# Patient Record
Sex: Male | Born: 1975 | Race: Black or African American | Hispanic: No | Marital: Single | Smoking: Current every day smoker
Health system: Southern US, Community
[De-identification: ages and names within clinical notes are randomized; demographics above are authoritative.]

---

## 2011-02-08 ENCOUNTER — Emergency Department: Payer: Self-pay | Admitting: Emergency Medicine

## 2011-03-08 ENCOUNTER — Emergency Department: Payer: Self-pay | Admitting: Emergency Medicine

## 2011-04-15 ENCOUNTER — Inpatient Hospital Stay: Payer: Self-pay | Admitting: Unknown Physician Specialty

## 2012-02-14 ENCOUNTER — Emergency Department: Payer: Self-pay | Admitting: Emergency Medicine

## 2012-04-19 ENCOUNTER — Emergency Department: Payer: Self-pay | Admitting: *Deleted

## 2012-04-19 LAB — ETHANOL: Ethanol %: 0.042 % (ref 0.000–0.080)

## 2012-04-19 LAB — CBC
HCT: 51.9 % (ref 40.0–52.0)
HGB: 17.3 g/dL (ref 13.0–18.0)
MCH: 33 pg (ref 26.0–34.0)
MCHC: 33.3 g/dL (ref 32.0–36.0)
Platelet: 204 10*3/uL (ref 150–440)
RBC: 5.24 10*6/uL (ref 4.40–5.90)
RDW: 12.8 % (ref 11.5–14.5)
WBC: 7.9 10*3/uL (ref 3.8–10.6)

## 2012-04-19 LAB — COMPREHENSIVE METABOLIC PANEL
Alkaline Phosphatase: 77 U/L (ref 50–136)
Anion Gap: 9 (ref 7–16)
BUN: 11 mg/dL (ref 7–18)
Co2: 27 mmol/L (ref 21–32)
EGFR (Non-African Amer.): >60
Osmolality: 275 (ref 275–301)
Potassium: 4 mmol/L (ref 3.5–5.1)
SGPT (ALT): 93 U/L — ABNORMAL HIGH
Total Protein: 8.6 g/dL — ABNORMAL HIGH (ref 6.4–8.2)

## 2012-04-19 LAB — DRUG SCREEN, URINE
Cannabinoid 50 Ng, Ur ~~LOC~~: NEGATIVE (ref ?–50)
Methadone, Ur Screen: NEGATIVE (ref ?–300)
Opiate, Ur Screen: NEGATIVE (ref ?–300)
Phencyclidine (PCP) Ur S: NEGATIVE (ref ?–25)

## 2012-04-19 LAB — TSH: Thyroid Stimulating Horm: 0.711 u[IU]/mL

## 2012-04-19 LAB — SALICYLATE LEVEL: Salicylates, Serum: <1.7 mg/dL

## 2015-02-19 NOTE — Consult Note (Signed)
PATIENT NAME:  Wynelle ClevelandCARTER, Jamieson D MR#:  [id number] DATE OF BIRTH:  09/26/1976  DATE OF CONSULTATION:  04/20/2012  REFERRING PHYSICIAN:  Gaetano NetKevin Boland, DO CONSULTING PHYSICIAN:  Annalyn Blecher B. Geroge Gilliam, MD  REASON FOR CONSULTATION: To evaluate a patient with history of bipolar.   IDENTIFYING DATA: Mr. Montez MoritaCarter is a 39 year old male with a history of mood instability and substance abuse.   CHIEF COMPLAINT: "I want my medications back."   HISTORY OF PRESENT ILLNESS: Mr. Montez MoritaCarter was hospitalized at [facility] almost a year ago for presumed bipolar disorder. He was discharged on Risperdal. He was noncompliant with treatment as he experienced sexual side effects. He was not compliant with Depakote either. It is unclear what precipitated his visit in the emergency room, but the best we can guess is that the patient is a habitual felon. He has been arrested and has multiple charges. His court date was today at 9 o'clock. We believe that he came to the emergency room so he can avoid his court date. When we called the [employer]erk of Court, they are well aware of his practices and there likely will be an arrest warrant for this patient issued. He denied suicidal or homicidal ideation. He denied psychotic symptoms, but felt that he needs to come to the hospital to get back on his medicine. However, he made it clear that the type of medicines that were prescribed to him in the past are unacceptable to him he is not going to comply. He has not been compliant with his doctor's appointments in the community either. He does not report any symptoms of depression, anxiety, or psychosis. He is not suicidal or homicidal, as above. He denies any substance use, but was positive for cocaine admission, which he denies.   PAST PSYCHIATRIC HISTORY: As above, he was reportedly diagnosed with bipolar. He was treated with Fanapt, Depakote, Risperdal, and trazodone. He admits to some drug use in the past, but not currently  in spite of evidence to the contrary.   FAMILY PSYCHIATRIC HISTORY: Sister with bipolar.   PAST MEDICAL HISTORY: None.  MEDICATIONS ON ADMISSION: None.   ALLERGIES: No known drug allergies.   SOCIAL HISTORY: He has a tenth grade education. He cannot keep a job. He is divorced. He has an 38 year old daughter whom he sees frequently. He lives with a girlfriend, but there is a conflict and it has not been clear whether the patient will be able to return to his current living arrangements. He is a smoker. He has no driver's license and he has over six DUI convictions.  REVIEW OF SYSTEMS: CONSTITUTIONAL: No fevers or chills. No weight changes. EYES: No double or blurred vision. ENT: No hearing loss. RESPIRATORY: No shortness of breath or cough. CARDIOVASCULAR: No chest pain or orthopnea. GASTROINTESTINAL: No abdominal pain, nausea, vomiting, or diarrhea. GU: No incontinence or frequency. ENDOCRINE: No heat or cold intolerance. LYMPHATIC: No anemia or easy bruising. INTEGUMENTARY: No acne or rash. MUSCULOSKELETAL: No muscle or joint pain. NEUROLOGIC: No tingling or weakness. PSYCHIATRIC: See history of present illness for details.   PHYSICAL EXAMINATION:   VITAL SIGNS: Blood pressure 153/86, pulse 68, respirations 18, and temperature 96.9.   GENERAL: This is a well-developed male in no acute distress. The rest of the physical examination is deferred to his primary attending.   LABORATORY DATA: Chemistries are within normal limits. Blood alcohol level is 0.042. LFTs within normal limits, except for AST of 83 and ALT of 93. TSH 0.711. Urine tox  screen positive for cocaine. CBC within normal limits. Serum acetaminophen and salicylates are low.   MENTAL STATUS EXAMINATION: The patient is alert and oriented to person, place, time, and situation. He is pleasant, polite, and cooperative. He is cool and collected. He maintains good eye contact. He is meticulously groomed wearing hospital scrubs. His speech  is of normal rhythm, rate, and volume. Mood is fine with full affect. Thought processing is logical and goal oriented with its own logic. Thought content - he denies thoughts of hurting himself or others. There are no delusions or paranoia. There are no auditory or visual hallucinations. His cognition is grossly intact. He registers three out of three and recalls three out of three objects after 5 minutes. He can spell world forwards and backwards. He knows the current president. His insight and judgment are questionable.   SUICIDE RISK ASSESSMENT: This is a patient with self-reported history of mood instability who is noncompliant with medication. He uses substances and has multiple legal charges. He wants to be started on medication and is determined to followup with a mental health provider in the community.   DIAGNOSES:   AXIS I:  1. Mood disorder not otherwise specified.  2. Cocaine and alcohol abuse.   AXIS II: Deferred.   AXIS III: None.   AXIS IV: Legal, mental illness, substance abuse, access to care, primary support, financial, housing, and employment.   AXIS V: GAF 45.   PLAN:  1. The patient does not meet criteria for inpatient psychiatric commitment.  2. I will start Tegretol for mood stabilization as it is cheap and does not cause sexual side effects that prevented the patient from taking the medication before. A prescription was provided.  3. Substance abuse. The patient was offered admission to [facility] for detox and [facility] for rehab. He declined. He will be referred to [facility] for substance abuse treatment and psychiatric medication management. His appointment is there tomorrow at 9:30 in the morning.  4. The patient will need a cab to get home to his girlfriend. ____________________________ Ellin Goodie. Jennet Maduro, MD jbp:slb D: 04/20/2012 19:51:55 ET     T: 04/21/2012 10:06:00 ET        JOB#: [id number] cc: Poet Hineman B. Jennet Maduro, MD, <Dictator> Shari Prows  MD ELECTRONICALLY SIGNED 04/24/2012 9:17

## 2015-02-19 NOTE — Consult Note (Signed)
Brief Consult Note: Diagnosis: Polysubstance dependence, Mood disorder NOS.   Patient was seen by consultant.   Consult note dictated.   Recommend further assessment or treatment.   Orders entered.   Discussed with Attending MD.   Comments: Mr. Montez MoritaCarter has a h/o mood instability, substance abuse and treatment noncompliance. He came to the ER asking for psychiatric admission in the context of failing court appearance today at 9:00. He is not suicidal, homicidal or psychotic. He wishes to be restarted on medications but admits that he does not contionue in the community as he has sexual side effects.   Substance abuse, daily drinking and MJ use, positive for cocaine as well as a h/o >DUI's suggest substance abuse as an underlying problem but the patient declines inpatient substance abuse treatment at [facility] or [facility]. He agrees to outpatient treatment for mood peroblems and substance abuse.   PLAN: 1. The patient does not meet criteria for admission to [facility].   2. The [facility] is [facility] substance abuse and mood instability treatment. His appointment  is tomorrow 9:30 am.   3. I start Tegretol for mood stabilization. It is cheep and does not cause sexual SE. Rx provided.   4. He will need a cab to get home..  Electronic Signatures: Kristine LineaPucilowska, Elisse Pennick (MD)  (Signed 24-Jun-13 12:51)  Authored: Brief Consult Note   Last Updated: 24-Jun-13 12:51 by Kristine LineaPucilowska, Apolo Cutshaw (MD)

## 2015-04-25 ENCOUNTER — Emergency Department: Payer: Self-pay

## 2015-04-25 ENCOUNTER — Encounter: Payer: Self-pay | Admitting: Emergency Medicine

## 2015-04-25 ENCOUNTER — Emergency Department
Admission: EM | Admit: 2015-04-25 | Discharge: 2015-04-25 | Disposition: A | Discharge status: Home / Self Care | Payer: Self-pay | Attending: Emergency Medicine | Admitting: Emergency Medicine

## 2015-04-25 DIAGNOSIS — Y9289 Other specified places as the place of occurrence of the external cause: Secondary | ICD-10-CM | POA: Insufficient documentation

## 2015-04-25 DIAGNOSIS — S39012A Strain of muscle, fascia and tendon of lower back, initial encounter: Secondary | ICD-10-CM | POA: Insufficient documentation

## 2015-04-25 DIAGNOSIS — X58XXXA Exposure to other specified factors, initial encounter: Secondary | ICD-10-CM | POA: Insufficient documentation

## 2015-04-25 DIAGNOSIS — Y9389 Activity, other specified: Secondary | ICD-10-CM | POA: Insufficient documentation

## 2015-04-25 DIAGNOSIS — Y998 Other external cause status: Secondary | ICD-10-CM | POA: Insufficient documentation

## 2015-04-25 DIAGNOSIS — M62830 Muscle spasm of back: Secondary | ICD-10-CM

## 2015-04-25 DIAGNOSIS — M5431 Sciatica, right side: Secondary | ICD-10-CM

## 2015-04-25 DIAGNOSIS — M5430 Sciatica, unspecified side: Secondary | ICD-10-CM | POA: Insufficient documentation

## 2015-04-25 MED ORDER — PREDNISONE 10 MG (21) PO TBPK: ORAL_TABLET | ORAL | Status: DC

## 2015-04-25 MED ORDER — KETOROLAC TROMETHAMINE 60 MG/2ML IM SOLN
60.0000 mg | Freq: Once | INTRAMUSCULAR | Status: AC
  Administered 2015-04-25: 60 mg via INTRAMUSCULAR

## 2015-04-25 MED ORDER — PREDNISONE 10 MG PO TABS
ORAL_TABLET | ORAL | Status: AC
  Administered 2015-04-25: 60 mg via ORAL
  Filled 2015-04-25: qty 6

## 2015-04-25 MED ORDER — HYDROCODONE-ACETAMINOPHEN 5-325 MG PO TABS
1.0000 | ORAL_TABLET | Freq: Four times a day (QID) | ORAL | Status: DC | PRN
Start: 2015-04-25 — End: 2017-11-02

## 2015-04-25 MED ORDER — METHOCARBAMOL 500 MG PO TABS: 500.0000 mg | ORAL_TABLET | Freq: Four times a day (QID) | ORAL | Status: DC

## 2015-04-25 MED ORDER — KETOROLAC TROMETHAMINE 60 MG/2ML IM SOLN
INTRAMUSCULAR | Status: AC
  Administered 2015-04-25: 60 mg via INTRAMUSCULAR
  Filled 2015-04-25: qty 2

## 2015-04-25 MED ORDER — PREDNISONE 20 MG PO TABS
60.0000 mg | ORAL_TABLET | Freq: Once | ORAL | Status: AC
  Administered 2015-04-25: 60 mg via ORAL

## 2015-04-25 MED ORDER — DIAZEPAM 5 MG/ML IJ SOLN
10.0000 mg | Freq: Once | INTRAMUSCULAR | Status: AC
  Administered 2015-04-25: 10 mg via INTRAMUSCULAR

## 2015-04-25 MED ORDER — DIAZEPAM 5 MG/ML IJ SOLN
INTRAMUSCULAR | Status: AC
  Administered 2015-04-25: 10 mg via INTRAMUSCULAR
  Filled 2015-04-25: qty 2

## 2015-04-25 NOTE — ED Notes (Signed)
Few days ago done some ehavy lifting and now has back pain

## 2015-04-25 NOTE — ED Provider Notes (Signed)
[facility] Emergency Department Provider Note  ____________________________________________  Time seen: 1206  I have reviewed the triage vital signs and the nursing notes.   HISTORY  Chief Complaint Back Pain    HPI Dominic Garrett is a 39 y.o. male arrives here today complaining oflow back and right-sided pain says the pain shoots down his right leg rates it as approximately a 10 out of 10 pain Sunday's lifting a generator it slipped since that pulled everything in his back now it hurts to extend his right leg or stand up straight from a sitting position denies any bowel or bladder incontinence numbness tingling or weakness in the extremity and otherwise has no complaints this time sitting still seems to make it better any type of movement makes it worse and is here today for further evaluation and treatment   History reviewed. No pertinent past medical history.  There are no active problems to display for this patient.   History reviewed. No pertinent past surgical history.  Current Outpatient Rx  Name  Route  Sig  Dispense  Refill  . HYDROcodone-acetaminophen (NORCO) 5-325 MG per tablet   Oral   Take 1 tablet by mouth every 6 (six) hours as needed for moderate pain.   12 tablet   0   . methocarbamol (ROBAXIN) 500 MG tablet   Oral   Take 1 tablet (500 mg total) by mouth 4 (four) times daily.   20 tablet   0   . predniSONE (STERAPRED UNI-PAK 21 TAB) 10 MG (21) TBPK tablet      Take 6 tablets on day 1 Take 5 tablets on day 2 Take 4 tablets on day 3 Take 3 tablets on day 4 Take 2 tablets on day 5 Take 1 tablet on day 6   21 tablet   0     Allergies Review of patient's allergies indicates no known allergies.  History reviewed. No pertinent family history.  Social History History  Substance Use Topics  . Smoking status: Current Every Day Smoker  . Smokeless tobacco: Not on file  . Alcohol Use: Yes    Review of  Systems Constitutional: No fever/chills Eyes: No visual changes. ENT: No sore throat. Cardiovascular: Denies chest pain. Respiratory: Denies shortness of breath. Gastrointestinal: No abdominal pain.  No nausea, no vomiting.  No diarrhea.  No constipation. Genitourinary: Negative for dysuria. Musculoskeletal: Negative for back pain. Skin: Negative for rash. Neurological: Negative for headaches, focal weakness or numbness.  10-point ROS otherwise negative.  ____________________________________________   PHYSICAL EXAM:  VITAL SIGNS: ED Triage Vitals  Enc Vitals Group     BP --      Pulse --      Resp --      Temp --      Temp src --      SpO2 --      Weight --      Height --      Head Cir --      Peak Flow --      Pain Score 04/25/15 1134 10     Pain Loc --      Pain Edu? --      Excl. in GC? --     Constitutional: Alert and oriented. Well appearing and in no acute distress. Eyes: Conjunctivae are normal. PERRL. EOMI. Head: Atraumatic. Nose: No congestion/rhinnorhea. Mouth/Throat: Mucous membranes are moist.  Oropharynx non-erythematous. Neck: No stridor.   Cardiovascular: Normal rate, regular rhythm. Grossly normal heart  sounds.  Good peripheral circulation. Respiratory: Normal respiratory effort.  No retractions. Lungs CTAB. Musculoskeletal: No lower extremity tenderness nor edema.  Tight spasm muscles across his low back no palpable step-offs or deformities from his cervical down through his lumbar sacral spine Neurologic:  Normal speech and language. No gross focal neurologic deficits are appreciated. Speech is normal. No gait instability. Negative straight leg bilaterally Skin:  Skin is warm, dry and intact. No rash noted. Psychiatric: Mood and affect are normal. Speech and behavior are normal.  ____________________________________________  RADIOLOGY  X-rays of the patient's lumbar sacral spine were negative as reviewed per the radiologist   I, Lynee Rosenbach,   Aidynn Krenn, Kristine Garbe, personally viewed and evaluated these images as part of my medical decision making.  ____________________________________________   PROCEDURES  Procedure(s) performed: None  Critical Care performed: No  ____________________________________________   INITIAL IMPRESSION / ASSESSMENT AND PLAN / ED COURSE  Pertinent labs & imaging results that were available during my care of the patient were reviewed by me and considered in my medical decision making (see chart for details). Initial impression on this patient lumbar sacral strain with spasms of the back muscles and right-sided radiculopathy negative x-rays after Valium Toradol and prednisone the patient is moving much better able to ambulate without difficulty given that and a reassuring exam for comfortable discharging home medications for symptomatic care is to follow-up with orthopedics if his symptoms persist return here for any acute concerns or worsening symptoms  ______________________________________   FINAL CLINICAL IMPRESSION(S) / ED DIAGNOSES  Final diagnoses:  Lumbosacral strain, initial encounter  Spasm of back muscles  Sciatica, right     Channell Quattrone Rosalyn Gess, PA-C 04/25/15 1356  Sharman Cheek, MD 04/25/15 [phone]

## 2015-04-25 NOTE — ED Notes (Signed)
Pt to ed with c/o right side back pain and right leg pain since Sunday.  Pt states he lifted heavy object on Sunday and has had increased pain since.  Pt states nothing helps pain.

## 2015-04-25 NOTE — ED Notes (Signed)
Patient transported to X-ray 

## 2016-02-24 ENCOUNTER — Emergency Department: Payer: Self-pay

## 2016-02-24 ENCOUNTER — Encounter: Payer: Self-pay | Admitting: Emergency Medicine

## 2016-02-24 ENCOUNTER — Observation Stay
Admission: EM | Admit: 2016-02-24 | Discharge: 2016-02-25 | Disposition: A | Discharge status: Home / Self Care | Payer: Self-pay | Attending: Internal Medicine | Admitting: Internal Medicine

## 2016-02-24 DIAGNOSIS — R61 Generalized hyperhidrosis: Secondary | ICD-10-CM | POA: Insufficient documentation

## 2016-02-24 DIAGNOSIS — Z79891 Long term (current) use of opiate analgesic: Secondary | ICD-10-CM | POA: Insufficient documentation

## 2016-02-24 DIAGNOSIS — F1910 Other psychoactive substance abuse, uncomplicated: Secondary | ICD-10-CM

## 2016-02-24 DIAGNOSIS — R740 Nonspecific elevation of levels of transaminase and lactic acid dehydrogenase [LDH]: Secondary | ICD-10-CM | POA: Insufficient documentation

## 2016-02-24 DIAGNOSIS — Z7952 Long term (current) use of systemic steroids: Secondary | ICD-10-CM | POA: Insufficient documentation

## 2016-02-24 DIAGNOSIS — F1490 Cocaine use, unspecified, uncomplicated: Secondary | ICD-10-CM | POA: Insufficient documentation

## 2016-02-24 DIAGNOSIS — R079 Chest pain, unspecified: Principal | ICD-10-CM | POA: Diagnosis present

## 2016-02-24 DIAGNOSIS — I214 Non-ST elevation (NSTEMI) myocardial infarction: Secondary | ICD-10-CM

## 2016-02-24 DIAGNOSIS — I959 Hypotension, unspecified: Secondary | ICD-10-CM | POA: Insufficient documentation

## 2016-02-24 DIAGNOSIS — N170 Acute kidney failure with tubular necrosis: Secondary | ICD-10-CM | POA: Insufficient documentation

## 2016-02-24 DIAGNOSIS — F17200 Nicotine dependence, unspecified, uncomplicated: Secondary | ICD-10-CM | POA: Insufficient documentation

## 2016-02-24 DIAGNOSIS — R4182 Altered mental status, unspecified: Secondary | ICD-10-CM | POA: Insufficient documentation

## 2016-02-24 LAB — PROTIME-INR
INR: 1.08
PROTHROMBIN TIME: 14.2 s (ref 11.4–15.0)

## 2016-02-24 LAB — COMPREHENSIVE METABOLIC PANEL
ALT: 165 U/L — ABNORMAL HIGH (ref 17–63)
AST: 273 U/L — AB (ref 15–41)
Albumin: 4.3 g/dL (ref 3.5–5.0)
Alkaline Phosphatase: 55 U/L (ref 38–126)
Anion gap: 13 (ref 5–15)
BUN: 20 mg/dL (ref 6–20)
CHLORIDE: 102 mmol/L (ref 101–111)
CO2: 23 mmol/L (ref 22–32)
Calcium: 8.7 mg/dL — ABNORMAL LOW (ref 8.9–10.3)
Creatinine, Ser: 1.53 mg/dL — ABNORMAL HIGH (ref 0.61–1.24)
GFR calc Af Amer: >60 mL/min (ref >60)
GFR calc non Af Amer: 56 mL/min — ABNORMAL LOW (ref >60)
Glucose, Bld: 106 mg/dL — ABNORMAL HIGH (ref 65–99)
POTASSIUM: 5.5 mmol/L — AB (ref 3.5–5.1)
Sodium: 138 mmol/L (ref 135–145)
TOTAL PROTEIN: 8.6 g/dL — AB (ref 6.5–8.1)
Total Bilirubin: 0.7 mg/dL (ref 0.3–1.2)

## 2016-02-24 LAB — CBC
HCT: 47.3 % (ref 40.0–52.0)
Hemoglobin: 16 g/dL (ref 13.0–18.0)
MCH: 32.4 pg (ref 26.0–34.0)
MCHC: 33.7 g/dL (ref 32.0–36.0)
MCV: 96.2 fL (ref 80.0–100.0)
PLATELETS: 161 10*3/uL (ref 150–440)
RBC: 4.92 MIL/uL (ref 4.40–5.90)
RDW: 12.9 % (ref 11.5–14.5)
WBC: 21.4 10*3/uL — AB (ref 3.8–10.6)

## 2016-02-24 LAB — TROPONIN I
TROPONIN I: 0.32 ng/mL — AB (ref <0.031)
Troponin I: 0.25 ng/mL — ABNORMAL HIGH (ref <0.031)

## 2016-02-24 LAB — APTT: aPTT: 28 seconds (ref 24–36)

## 2016-02-24 LAB — URINE DRUG SCREEN, QUALITATIVE (ARMC ONLY)
Amphetamines, Ur Screen: NOT DETECTED
Barbiturates, Ur Screen: NOT DETECTED
Benzodiazepine, Ur Scrn: NOT DETECTED
CANNABINOID 50 NG, UR ~~LOC~~: NOT DETECTED
COCAINE METABOLITE, UR ~~LOC~~: POSITIVE — AB
MDMA (ECSTASY) UR SCREEN: NOT DETECTED
Methadone Scn, Ur: NOT DETECTED
OPIATE, UR SCREEN: NOT DETECTED
Phencyclidine (PCP) Ur S: NOT DETECTED
TRICYCLIC, UR SCREEN: NOT DETECTED

## 2016-02-24 LAB — ACETAMINOPHEN LEVEL: Acetaminophen (Tylenol), Serum: <10 ug/mL — ABNORMAL LOW (ref 10–30)

## 2016-02-24 LAB — GLUCOSE, CAPILLARY: GLUCOSE-CAPILLARY: 106 mg/dL — AB (ref 65–99)

## 2016-02-24 MED ORDER — DOCUSATE SODIUM 100 MG PO CAPS
100.0000 mg | ORAL_CAPSULE | Freq: Two times a day (BID) | ORAL | Status: DC
  Administered 2016-02-24: 100 mg via ORAL
  Filled 2016-02-24: qty 1

## 2016-02-24 MED ORDER — ONDANSETRON HCL 4 MG/2ML IJ SOLN: 4.0000 mg | Freq: Four times a day (QID) | INTRAMUSCULAR | Status: DC | PRN

## 2016-02-24 MED ORDER — BISACODYL 5 MG PO TBEC: 5.0000 mg | DELAYED_RELEASE_TABLET | Freq: Every day | ORAL | Status: DC | PRN

## 2016-02-24 MED ORDER — NALOXONE HCL 2 MG/2ML IJ SOSY
PREFILLED_SYRINGE | INTRAMUSCULAR | Status: AC
  Filled 2016-02-24: qty 2

## 2016-02-24 MED ORDER — LORAZEPAM 2 MG/ML IJ SOLN
INTRAMUSCULAR | Status: AC
  Filled 2016-02-24: qty 1

## 2016-02-24 MED ORDER — ACETAMINOPHEN 650 MG RE SUPP: 650.0000 mg | Freq: Four times a day (QID) | RECTAL | Status: DC | PRN

## 2016-02-24 MED ORDER — ONDANSETRON HCL 4 MG PO TABS: 4.0000 mg | ORAL_TABLET | Freq: Four times a day (QID) | ORAL | Status: DC | PRN

## 2016-02-24 MED ORDER — ASPIRIN 81 MG PO CHEW: 324.0000 mg | CHEWABLE_TABLET | Freq: Once | ORAL | Status: DC

## 2016-02-24 MED ORDER — LORAZEPAM 2 MG/ML IJ SOLN
1.0000 mg | Freq: Once | INTRAMUSCULAR | Status: AC
  Administered 2016-02-24: 1 mg via INTRAVENOUS

## 2016-02-24 MED ORDER — ASPIRIN EC 81 MG PO TBEC: 81.0000 mg | DELAYED_RELEASE_TABLET | Freq: Every day | ORAL | Status: DC

## 2016-02-24 MED ORDER — SODIUM CHLORIDE 0.9 % IV SOLN
1000.0000 mL | Freq: Once | INTRAVENOUS | Status: AC
  Administered 2016-02-24: 1000 mL via INTRAVENOUS

## 2016-02-24 MED ORDER — NALOXONE HCL 2 MG/2ML IJ SOSY
1.0000 mg | PREFILLED_SYRINGE | Freq: Once | INTRAMUSCULAR | Status: AC
  Administered 2016-02-24: 1 mg via INTRAVENOUS

## 2016-02-24 MED ORDER — NALOXONE HCL 2 MG/2ML IJ SOSY: 0.4000 mg | PREFILLED_SYRINGE | Freq: Once | INTRAMUSCULAR | Status: DC

## 2016-02-24 MED ORDER — ACETAMINOPHEN 325 MG PO TABS: 650.0000 mg | ORAL_TABLET | Freq: Four times a day (QID) | ORAL | Status: DC | PRN

## 2016-02-24 MED ORDER — HEPARIN BOLUS VIA INFUSION
4000.0000 [IU] | Freq: Once | INTRAVENOUS | Status: AC
  Administered 2016-02-24: 4000 [IU] via INTRAVENOUS
  Filled 2016-02-24: qty 4000

## 2016-02-24 MED ORDER — HEPARIN (PORCINE) IN NACL 100-0.45 UNIT/ML-% IJ SOLN
1150.0000 [IU]/h | INTRAMUSCULAR | Status: DC
  Administered 2016-02-24: 900 [IU]/h via INTRAVENOUS
  Filled 2016-02-24 (×3): qty 250

## 2016-02-24 NOTE — ED Provider Notes (Signed)
[facility] Emergency Department Provider Note  ____________________________________________    I have reviewed the triage vital signs and the nursing notes.   HISTORY  Chief Complaint Drug Overdose  Significantly limited history due to altered mental status  HPI Dominic Garrett is a 40 y.o. male who presents with confusion, weakness. He does report that he did cocaine and Molly at a party last night but history is significantly limited. He is diaphoretic and hypotensive     No past medical history on file.  There are no active problems to display for this patient.   History reviewed. No pertinent past surgical history.  Current Outpatient Rx  Name  Route  Sig  Dispense  Refill  . HYDROcodone-acetaminophen (NORCO) 5-325 MG per tablet   Oral   Take 1 tablet by mouth every 6 (six) hours as needed for moderate pain.   12 tablet   0   . methocarbamol (ROBAXIN) 500 MG tablet   Oral   Take 1 tablet (500 mg total) by mouth 4 (four) times daily.   20 tablet   0   . predniSONE (STERAPRED UNI-PAK 21 TAB) 10 MG (21) TBPK tablet      Take 6 tablets on day 1 Take 5 tablets on day 2 Take 4 tablets on day 3 Take 3 tablets on day 4 Take 2 tablets on day 5 Take 1 tablet on day 6   21 tablet   0     Allergies Review of patient's allergies indicates no known allergies.  No family history on file.  Social History Social History  Substance Use Topics  . Smoking status: Current Every Day Smoker  . Smokeless tobacco: None  . Alcohol Use: Yes    Level V caveat: Review of Systems significantly limited by altered mental status  Constitutional: Dizzy Cardiovascular: Denies chest pain Respiratory: Denies shortness of breath     ____________________________________________   PHYSICAL EXAM:  VITAL SIGNS: ED Triage Vitals  Enc Vitals Group     BP 02/24/16 1505 126/92 mmHg     Pulse Rate 02/24/16 1505 96     Resp 02/24/16 1505 16   Temp --      Temp src --      SpO2 02/24/16 1505 93 %     Weight 02/24/16 1505 160 lb (72.576 kg)     Height 02/24/16 1505 5\' 6"  (1.676 m)     Head Cir --      Peak Flow --      Pain Score --      Pain Loc --      Pain Edu? --      Excl. in GC? --      Constitutional: Diaphoretic, ill-appearing, confused Eyes: Conjunctivae are normal. No erythema or injection. Pinpoint pupils bilaterally ENT   Head: Normocephalic and atraumatic.   Mouth/Throat: Mucous membranes are moist. Cardiovascular: Normal rate, regular rhythm. Normal and symmetric distal pulses are present in the upper extremities.  Respiratory: Normal respiratory effort without tachypnea nor retractions. Breath sounds are clear and equal bilaterally.  Gastrointestinal: Soft and non-tender in all quadrants. No distention.  Genitourinary: deferred Musculoskeletal:  No lower extremity tenderness nor edema. Neurologic:  Patient is confused and drowsy but arousable. He moves all extremities but has pinpoint pupils bilaterally . Skin:  Skin is warm, diaphoretic and intact. No rash noted. Psychiatric: Noted exam due to altered mental status  ____________________________________________    LABS (pertinent positives/negatives)  Labs Reviewed  GLUCOSE, CAPILLARY -  Abnormal; Notable for the following:    Glucose-Capillary 106 (*)    All other components within normal limits  CBC - Abnormal; Notable for the following:    WBC 21.4 (*)    All other components within normal limits  COMPREHENSIVE METABOLIC PANEL - Abnormal; Notable for the following:    Potassium 5.5 (*)    Glucose, Bld 106 (*)    Creatinine, Ser 1.53 (*)    Calcium 8.7 (*)    Total Protein 8.6 (*)    AST 273 (*)    ALT 165 (*)    GFR calc non Af Amer 56 (*)    All other components within normal limits  TROPONIN I - Abnormal; Notable for the following:    Troponin I 0.25 (*)    All other components within normal limits  ACETAMINOPHEN LEVEL -  Abnormal; Notable for the following:    Acetaminophen (Tylenol), Serum <10 (*)    All other components within normal limits  URINE DRUG SCREEN, QUALITATIVE ([facility] ONLY)    ____________________________________________   EKG  ED ECG REPORT I, Jene Every, the attending physician, personally viewed and interpreted this ECG.   Date: 02/24/2016  EKG Time: 3:10 PM  Rate: 95  Rhythm: normal sinus rhythm  Axis: Right axis deviation  Intervals:none  ST&T Change: Nonspecific ST changes diffusely   ____________________________________________    RADIOLOGY  Chest x-ray unremarkable, CT head unremarkable  ____________________________________________   PROCEDURES  Procedure(s) performed: none  Critical Care performed: yes  CRITICAL CARE Performed by: Jene Every   Total critical care time: 30 minutes  Critical care time was exclusive of separately billable procedures and treating other patients.  Critical care was necessary to treat or prevent imminent or life-threatening deterioration.  Critical care was time spent personally by me on the following activities: development of treatment plan with patient and/or surrogate as well as nursing, discussions with consultants, evaluation of patient's response to treatment, examination of patient, obtaining history from patient or surrogate, ordering and performing treatments and interventions, ordering and review of laboratory studies, ordering and review of radiographic studies, pulse oximetry and re-evaluation of patient's condition.  ____________________________________________   INITIAL IMPRESSION / ASSESSMENT AND PLAN / ED COURSE  Pertinent labs & imaging results that were available during my care of the patient were reviewed by me and considered in my medical decision making (see chart for details).  Patient presents diaphoretic, with altered mental status and hypotension (80/60) with reported drug abuse last night. IV  fluids started. Narcan 1 mg given with rapid improvement in blood pressure however patient became aggressive and remained confused. We were able to talk him out of ripping out his IV but I decided to give him 1 mg of Ativan IV to prevent him from lashing out at staff and/or injuring himself.   ----------------------------------------- 4:00 PM on 02/24/2016 -----------------------------------------  Notified of elevated troponin. Likely related to drug abuse. EKG not consistent with STEMI. Patient resting quietly at this time  ----------------------------------------- 4:43 PM on 02/24/2016 -----------------------------------------  Discussed with Dr. Graciela Husbands of cardiology who recommends heparin and aspirin   ____________________________________________   FINAL CLINICAL IMPRESSION(S) / ED DIAGNOSES  Final diagnoses:  NSTEMI (non-ST elevated myocardial infarction) [facility])  Drug abuse  Altered mental status, unspecified altered mental status type          Jene Every, MD 02/24/16 1651

## 2016-02-24 NOTE — ED Notes (Signed)
Pt sleeping, went to CT at this time.  Pt arousable, but falls back to sleep quickly.

## 2016-02-24 NOTE — H&P (Signed)
[facility] [place] at [facility]   PATIENT NAME: Dominic SlickerQuenton Bosch    MR#:  [id number]  DATE OF BIRTH:  Mar 22, 1976  DATE OF ADMISSION:  02/24/2016  PRIMARY CARE PHYSICIAN: No primary care provider on file.   REQUESTING/REFERRING PHYSICIAN: Dr.Kinner  CHIEF COMPLAINT: Altered mental status    Chief Complaint  Patient presents with  . Drug Overdose    HISTORY OF PRESENT ILLNESS:  Dominic SlickerQuenton Murgia  is a 40 y.o. male  unknown primary history came into the ER because of confusion and weakness. Patient also was hypotensive and he came. Patient presented to ED with report of passing out. Patient went to party last night did the cocaine and Molly.(Ecstasy)he was diaphoretic and confused when he came.he was very agitated and trying to remove wires ,that time he received narcan and Iv fluids.they both helped.pt troponin upto 0.25. Cardiology on-call Dr. Alberteen Spindleline recommended a heparin drip. intiall Bp is 80/60 / now bpPressure 126/96 at this time. Also received Ativan because of agitation. Urine toxicology is positive for cocaine.  PAST MEDICAL HISTORY:  No past medical history on file.  PAST SURGICAL HISTOIRY:  History reviewed. No pertinent past surgical history.  SOCIAL HISTORY:   Social History  Substance Use Topics  . Smoking status: Current Every Day Smoker  . Smokeless tobacco: Not on file  . Alcohol Use: Yes    FAMILY HISTORY:  No family history on file. Unable to do family history due to sedation DRUG ALLERGIES:  No Known Allergies  REVIEW OF SYSTEMS:  Unable to do review of system. As pt is sedated with ativan  MEDICATIONS AT HOME:   Prior to Admission medications   Medication Sig Start Date End Date Taking? Authorizing Provider  HYDROcodone-acetaminophen (NORCO) 5-325 MG per tablet Take 1 tablet by mouth every 6 (six) hours as needed for moderate pain. 04/25/15   III Kristine GarbeWilliam C Ruffian, PA-C  methocarbamol (ROBAXIN) 500 MG tablet Take 1 tablet (500  mg total) by mouth 4 (four) times daily. 04/25/15   III William C Ruffian, PA-C  predniSONE (STERAPRED UNI-PAK 21 TAB) 10 MG (21) TBPK tablet Take 6 tablets on day 1 Take 5 tablets on day 2 Take 4 tablets on day 3 Take 3 tablets on day 4 Take 2 tablets on day 5 Take 1 tablet on day 6 04/25/15   III William C Ruffian, PA-C      VITAL SIGNS:  Blood pressure 126/92, pulse 96, resp. rate 16, height 5\' 6"  (1.676 m), weight 72.576 kg (160 lb), SpO2 93 %.  PHYSICAL EXAMINATION:  GENERAL:  40 y.o.-year-old patient lying in the bed with no acute distress.  EYES: Pupils constricted,, reactive to light and accommodation. No scleral icterus. Extraocular muscles intact.  HEENT: Head atraumatic, normocephalic. Oropharynx and nasopharynx clear.  NECK:  Supple, no jugular venous distention. No thyroid enlargement, no tenderness.  LUNGS: Normal breath sounds bilaterally, no wheezing, rales,rhonchi or crepitation. No use of accessory muscles of respiration.  CARDIOVASCULAR: S1, S2 normal. No murmurs, rubs, or gallops.  ABDOMEN: Soft, nontender, nondistended. Bowel sounds present. No organomegaly or mass.  EXTREMITIES: No pedal edema, cyanosis, or clubbing.  NEUROLOGIC: unable to do full neurological exam due to sedation   PSYCHIATRIC: The patient is sedated/ SKIN: No obvious rash, lesion, or ulcer.   LABORATORY PANEL:   CBC  Recent Labs Lab 02/24/16 1507  WBC 21.4*  HGB 16.0  HCT 47.3  PLT 161   ------------------------------------------------------------------------------------------------------------------  Chemistries   Recent Labs  Lab 02/24/16 1507  NA 138  K 5.5*  CL 102  CO2 23  GLUCOSE 106*  BUN 20  CREATININE 1.53*  CALCIUM 8.7*  AST 273*  ALT 165*  ALKPHOS 55  BILITOT 0.7   ------------------------------------------------------------------------------------------------------------------  Cardiac Enzymes  Recent Labs Lab 02/24/16 1507  TROPONINI 0.25*    ------------------------------------------------------------------------------------------------------------------  RADIOLOGY:  Ct Head Wo Contrast  02/24/2016  CLINICAL DATA:  Altered mental status, cocaine use yesterday EXAM: CT HEAD WITHOUT CONTRAST TECHNIQUE: Contiguous axial images were obtained from the base of the skull through the vertex without intravenous contrast. COMPARISON:  04/14/2011 FINDINGS: No evidence of hemorrhage or extra-axial fluid. No evidence of mass or vascular territory infarct. Left temporal horn is diminutive and asymmetric compared to the contralateral side but the appearance is identical to the prior study. No hydrocephalus. Calvarium intact. Visualized portions of paranasal sinuses clear. IMPRESSION: No acute intracranial abnormality. Electronically Signed   By: Esperanza Heir M.D.   On: 02/24/2016 16:42   Dg Chest Portable 1 View  02/24/2016  CLINICAL DATA:  Drug use last night, diaphoretic and confused. EXAM: PORTABLE CHEST 1 VIEW COMPARISON:  None. FINDINGS: Heart size is upper normal. Perhaps mild central pulmonary vascular congestion but not convincing. Lungs otherwise clear. No pleural effusion or pneumothorax seen. Osseous structures about the chest are unremarkable. IMPRESSION: Perhaps mild central pulmonary vascular congestion. Otherwise unremarkable chest x-ray. Electronically Signed   By: Bary Richard M.D.   On: 02/24/2016 16:42    EKG:   Orders placed or performed during the hospital encounter of 02/24/16  . ED EKG  . ED EKG  . EKG 12-Lead  . EKG 12-Lead  Normal sinus rhythm at 95 bpm no ST-T changes.  IMPRESSION AND PLAN:   1 .altered mental status with hypotension likely secondary to drug overdose with cocaine. Unknown if he overdosed on his pain medicines as well because narcan helped  him , blood pressure improved from 80/60 he became more alert after he received Narcan. #2 acute renal failure due to ATN:  Due to To hypotension. start IV  fluids. 3.Cocaine induced elevation of troponins: Cardiology recommended monitoring on telemetry, heparin drip and aspirin, cycle troponins, cardiology consult in the morning. Her for aggressiveness: Psych consult. He received ativan All the records are reviewed and case discussed with ED provider. Management plans discussed with the patient, family and they are in agreement.  CODE STATUS: full  TOTAL TIME TAKING CARE OF THIS PATIENT: 45  minutes.    Katha Hamming M.D on 02/24/2016 at 5:15 PM  Between 7am to 6pm - Pager - [phone]  After 6pm go to [email or web address] - password [email or web address] [facility]  [place] [facility]  Office  [phone]  CC: Primary care physician; No primary care provider on file.  Note: This dictation was prepared with Dragon dictation along with smaller phrase technology. Any transcriptional errors that result from this process are unintentional.

## 2016-02-24 NOTE — ED Notes (Signed)
Dr. Kinner notified of elevated troponin. 

## 2016-02-24 NOTE — ED Notes (Signed)
Pt hostile and demanding monitor wires be removed from his chest and wanting to leave. Dr. Cyril LoosenKinner at bedside. Dr. Cyril LoosenKinner removed lead wires and blood pressure cuff. Dr. Cyril LoosenKinner spoke with patient regarding staying for observation. Pt agreed to stay.

## 2016-02-24 NOTE — ED Notes (Signed)
Pt unable to take aspirin 324 at this time because of drowsiness, will continue to monitor for more alertness to administer medication.

## 2016-02-24 NOTE — Progress Notes (Signed)
Pt accepted on admission to rm 256. Very sedated from ativan in e.d. Heparin gtt infusing a t 9 ml /hr.pt stood to void in urinal. Spec sent. Pt a high falls level for present time. Vss. Tele nsr. He is painfree. No skin breakdown noted.

## 2016-02-24 NOTE — ED Notes (Signed)
Pt presents to ED with reports of passing out. Pt states went to a party last night and did molly and cocaine. Pt diaphoretic and confused.

## 2016-02-24 NOTE — Progress Notes (Signed)
ANTICOAGULATION CONSULT NOTE - Initial Consult  Pharmacy Consult for Heparin Indication: EKG changes not consistent with STEMI   No Known Allergies  Patient Measurements: Height: 5\' 6"  (167.6 cm) Weight: 160 lb (72.576 kg) IBW/kg (Calculated) : 63.8 Heparin Dosing Weight: 72.6 kg  Vital Signs: BP: 126/92 mmHg (04/29 1505) Pulse Rate: 96 (04/29 1505)  Labs:  Recent Labs  02/24/16 1507  HGB 16.0  HCT 47.3  PLT 161  CREATININE 1.53*  TROPONINI 0.25*    Estimated Creatinine Clearance: 59.1 mL/min (by C-G formula based on Cr of 1.53).   Medical History: No past medical history on file.  Medications:  Infusions:  . heparin      Assessment: Patient presents diaphoretc , altered mental status and hypotension with reported drug abuse last night.   Elevated troponin (likely related to drug abuse) and EKG not consistent with STEMI however Cardiology recommends heparin and aspirin therapy  Goal of Therapy:  Heparin level 0.3-0.7 units/ml Monitor platelets by anticoagulation protocol: Yes   Plan:  Give 4000 units bolus x 1 Start heparin infusion at 900 units/hr Check anti-Xa level in 6 hours and daily while on heparin Continue to monitor H&H and platelets  Malakie Balis K RPh 02/24/2016,5:06 PM

## 2016-02-24 NOTE — ED Notes (Signed)
Attempted to call report on patient, nurse unavailable at this time.  Advised to call back to give report.

## 2016-02-25 DIAGNOSIS — R7989 Other specified abnormal findings of blood chemistry: Secondary | ICD-10-CM

## 2016-02-25 LAB — CBC
HEMATOCRIT: 41.5 % (ref 40.0–52.0)
HEMOGLOBIN: 14.2 g/dL (ref 13.0–18.0)
MCH: 33 pg (ref 26.0–34.0)
MCHC: 34.2 g/dL (ref 32.0–36.0)
MCV: 96.7 fL (ref 80.0–100.0)
Platelets: 124 10*3/uL — ABNORMAL LOW (ref 150–440)
RBC: 4.29 MIL/uL — ABNORMAL LOW (ref 4.40–5.90)
RDW: 12.8 % (ref 11.5–14.5)
WBC: 11.2 10*3/uL — ABNORMAL HIGH (ref 3.8–10.6)

## 2016-02-25 LAB — BASIC METABOLIC PANEL
ANION GAP: 6 (ref 5–15)
BUN: 14 mg/dL (ref 6–20)
CO2: 29 mmol/L (ref 22–32)
Calcium: 8.2 mg/dL — ABNORMAL LOW (ref 8.9–10.3)
Chloride: 102 mmol/L (ref 101–111)
Creatinine, Ser: 1.07 mg/dL (ref 0.61–1.24)
GFR calc Af Amer: >60 mL/min (ref >60)
GFR calc non Af Amer: >60 mL/min (ref >60)
GLUCOSE: 105 mg/dL — AB (ref 65–99)
POTASSIUM: 3.9 mmol/L (ref 3.5–5.1)
Sodium: 137 mmol/L (ref 135–145)

## 2016-02-25 LAB — TROPONIN I
Troponin I: 0.18 ng/mL — ABNORMAL HIGH (ref <0.031)
Troponin I: 0.24 ng/mL — ABNORMAL HIGH (ref <0.031)

## 2016-02-25 LAB — GLUCOSE, CAPILLARY: Glucose-Capillary: 85 mg/dL (ref 65–99)

## 2016-02-25 LAB — HEPARIN LEVEL (UNFRACTIONATED)
Heparin Unfractionated: 0.57 IU/mL (ref 0.30–0.70)
Heparin Unfractionated: 0.7 IU/mL (ref 0.30–0.70)

## 2016-02-25 MED ORDER — HEPARIN BOLUS VIA INFUSION
2200.0000 [IU] | Freq: Once | INTRAVENOUS | Status: AC
  Administered 2016-02-25: 2200 [IU] via INTRAVENOUS
  Filled 2016-02-25: qty 2200

## 2016-02-25 NOTE — Progress Notes (Signed)
Discharge instructions given. Dr. Odessa FlemingKlein's office will be calling for a stress test. Patient given office number for reference. IV's and tele removed. Patient has no questions and no new medications. Wife is on the way to pick him up.

## 2016-02-25 NOTE — Progress Notes (Signed)
Dr. Graciela HusbandsKlein stated patient can go home today. Paged Dr. Elisabeth PigeonVachhani to notify and MD is doing discharge now. Patient is ready to leave currently. Will prepare discharge as soon as ready.

## 2016-02-25 NOTE — Progress Notes (Signed)
ANTICOAGULATION CONSULT NOTE - Initial Consult  Pharmacy Consult for Heparin Indication: EKG changes not consistent with STEMI   No Known Allergies  Patient Measurements: Height: 5\' 6"  (167.6 cm) Weight: 154 lb 9.6 oz (70.126 kg) IBW/kg (Calculated) : 63.8 Heparin Dosing Weight: 72.6 kg  Vital Signs: Temp: 97.9 F (36.6 C) (04/30 0016) Temp Source: Oral (04/30 0016) BP: 131/87 mmHg (04/30 0016) Pulse Rate: 86 (04/30 0016)  Labs:  Recent Labs  02/24/16 1507 02/24/16 1803 02/25/16 0005  HGB 16.0  --   --   HCT 47.3  --   --   PLT 161  --   --   APTT 28  --   --   LABPROT 14.2  --   --   INR 1.08  --   --   HEPARINUNFRC  --   --  0.70  CREATININE 1.53*  --   --   TROPONINI 0.25* 0.32* 0.24*    Estimated Creatinine Clearance: 59.1 mL/min (by C-G formula based on Cr of 1.53).   Medical History: No past medical history on file.  Medications:  Infusions:  . heparin 900 Units/hr (02/24/16 1731)    Assessment: Patient presents diaphoretc , altered mental status and hypotension with reported drug abuse last night.   Elevated troponin (likely related to drug abuse) and EKG not consistent with STEMI however Cardiology recommends heparin and aspirin therapy  Goal of Therapy:  Heparin level 0.3-0.7 units/ml Monitor platelets by anticoagulation protocol: Yes   Plan:  Heparin level therapeutic x 1. Continue current rate. Will recheck level in 6 hours.  Carola FrostNathan A Anthonymichael Munday, Pharm.D., BCPS Clinical Pharmacist  02/25/2016,1:01 AM

## 2016-02-25 NOTE — Discharge Summary (Signed)
[facility] - [place] at [facility]   PATIENT NAME: Dominic Garrett    MR#:  [id number]  DATE OF BIRTH:  01-12-1976  DATE OF ADMISSION:  02/24/2016 ADMITTING PHYSICIAN: Katha Hamming, MD  DATE OF DISCHARGE: 02/25/2016  PRIMARY CARE PHYSICIAN: No primary care provider on file.    ADMISSION DIAGNOSIS:  Drug abuse [F19.10] NSTEMI (non-ST elevated myocardial infarction) (HCC) [I21.4] Altered mental status, unspecified altered mental status type [R41.82]  DISCHARGE DIAGNOSIS:  Active Problems:   Chest pain   MI ruled out.  SECONDARY DIAGNOSIS:  No past medical history on file.  HOSPITAL COURSE:   Pt came with chest pain, and had used coccaine.  Had renal failure and slightly high troponin on presentation, so cardiology suggested to start on heparin IV drip and follow on telemetry. On further follow-up. Troponin came down and he remained asymptomatic. He was seen by cardiologist and as per the phone conversation with him and nurse cardiologist suggested to discharge the patient home, I do not see an official note so far but this is as further from conversation. Non-ST elevation MI was suspected on admission but ruled out.  His renal function improved after IV fluid and is completely normal now.  He was also counseled to quit smoking for 4 minutes.  DISCHARGE CONDITIONS:   Stable.  CONSULTS OBTAINED:  Treatment Team:  Duke Salvia, MD  DRUG ALLERGIES:  No Known Allergies  DISCHARGE MEDICATIONS:   Current Discharge Medication List    CONTINUE these medications which have NOT CHANGED   Details  HYDROcodone-acetaminophen (NORCO) 5-325 MG per tablet Take 1 tablet by mouth every 6 (six) hours as needed for moderate pain. Qty: 12 tablet, Refills: 0    methocarbamol (ROBAXIN) 500 MG tablet Take 1 tablet (500 mg total) by mouth 4 (four) times daily. Qty: 20 tablet, Refills: 0      STOP taking these medications     predniSONE (STERAPRED  UNI-PAK 21 TAB) 10 MG (21) TBPK tablet          DISCHARGE INSTRUCTIONS:    Follow with PMD in 1-2 weeks.  If you experience worsening of your admission symptoms, develop shortness of breath, life threatening emergency, suicidal or homicidal thoughts you must seek medical attention immediately by calling 911 or calling your MD immediately  if symptoms less severe.  You Must read complete instructions/literature along with all the possible adverse reactions/side effects for all the Medicines you take and that have been prescribed to you. Take any new Medicines after you have completely understood and accept all the possible adverse reactions/side effects.   Please note  You were cared for by a hospitalist during your hospital stay. If you have any questions about your discharge medications or the care you received while you were in the hospital after you are discharged, you can call the unit and asked to speak with the hospitalist on call if the hospitalist that took care of you is not available. Once you are discharged, your primary care physician will handle any further medical issues. Please note that NO REFILLS for any discharge medications will be authorized once you are discharged, as it is imperative that you return to your primary care physician (or establish a relationship with a primary care physician if you do not have one) for your aftercare needs so that they can reassess your need for medications and monitor your lab values.    Today   CHIEF COMPLAINT:   Chief Complaint  Patient  presents with  . Drug Overdose    HISTORY OF PRESENT ILLNESS:  Dominic Garrett  is a 40 y.o. male with unknown primary history came into the ER because of confusion and weakness. Patient also was hypotensive and he came. Patient presented to ED with report of passing out. Patient went to party last night did the cocaine and Molly.(Ecstasy)he was diaphoretic and confused when he came.he was very  agitated and trying to remove wires ,that time he received narcan and Iv fluids.they both helped.pt troponin upto 0.25. Cardiology on-call Dr. Alberteen Spindle recommended a heparin drip. intiall Bp is 80/60 / now bpPressure 126/96 at this time. Also received Ativan because of agitation. Urine toxicology is positive for cocaine.    VITAL SIGNS:  Blood pressure 128/81, pulse 82, temperature 98.2 F (36.8 C), temperature source Oral, resp. rate 16, height  (1.676 m), weight 70.126 kg (154 lb 9.6 oz), SpO2 94 %.  I/O:   Intake/Output Summary (Last 24 hours) at 02/25/16 1245 Last data filed at 02/25/16 1100  Gross per 24 hour  Intake 114.15 ml  Output   3650 ml  Net -3535.85 ml    PHYSICAL EXAMINATION:  GENERAL:  40 y.o.-year-old patient lying in the bed with no acute distress.  EYES: Pupils equal, round, reactive to light and accommodation. No scleral icterus. Extraocular muscles intact.  HEENT: Head atraumatic, normocephalic. Oropharynx and nasopharynx clear.  NECK:  Supple, no jugular venous distention. No thyroid enlargement, no tenderness.  LUNGS: Normal breath sounds bilaterally, no wheezing, rales,rhonchi or crepitation. No use of accessory muscles of respiration.  CARDIOVASCULAR: S1, S2 normal. No murmurs, rubs, or gallops.  ABDOMEN: Soft, non-tender, non-distended. Bowel sounds present. No organomegaly or mass.  EXTREMITIES: No pedal edema, cyanosis, or clubbing.  NEUROLOGIC: Cranial nerves II through XII are intact. Muscle strength 5/5 in all extremities. Sensation intact. Gait not checked.  PSYCHIATRIC: The patient is alert and oriented x 3.  SKIN: No obvious rash, lesion, or ulcer.   DATA REVIEW:   CBC  Recent Labs Lab 02/25/16 0555  WBC 11.2*  HGB 14.2  HCT 41.5  PLT 124*    Chemistries   Recent Labs Lab 02/24/16 1507 02/25/16 0555  NA 138 137  K 5.5* 3.9  CL 102 102  CO2 23 29  GLUCOSE 106* 105*  BUN 20 14  CREATININE 1.53* 1.07  CALCIUM 8.7* 8.2*  AST  273*  --   ALT 165*  --   ALKPHOS 55  --   BILITOT 0.7  --     Cardiac Enzymes  Recent Labs Lab 02/25/16 0555  TROPONINI 0.18*    Microbiology Results  No results found for this or any previous visit.  RADIOLOGY:  Ct Head Wo Contrast  02/24/2016  CLINICAL DATA:  Altered mental status, cocaine use yesterday EXAM: CT HEAD WITHOUT CONTRAST TECHNIQUE: Contiguous axial images were obtained from the base of the skull through the vertex without intravenous contrast. COMPARISON:  04/14/2011 FINDINGS: No evidence of hemorrhage or extra-axial fluid. No evidence of mass or vascular territory infarct. Left temporal horn is diminutive and asymmetric compared to the contralateral side but the appearance is identical to the prior study. No hydrocephalus. Calvarium intact. Visualized portions of paranasal sinuses clear. IMPRESSION: No acute intracranial abnormality. Electronically Signed   By: Esperanza Heir M.D.   On: 02/24/2016 16:42   Dg Chest Portable 1 View  02/24/2016  CLINICAL DATA:  Drug use last night, diaphoretic and confused. EXAM: PORTABLE CHEST 1 VIEW COMPARISON:  None. FINDINGS: Heart size is upper normal. Perhaps mild central pulmonary vascular congestion but not convincing. Lungs otherwise clear. No pleural effusion or pneumothorax seen. Osseous structures about the chest are unremarkable. IMPRESSION: Perhaps mild central pulmonary vascular congestion. Otherwise unremarkable chest x-ray. Electronically Signed   By: Bary RichardStan  Maynard M.D.   On: 02/24/2016 16:42    EKG:   Orders placed or performed during the hospital encounter of 02/24/16  . ED EKG  . ED EKG  . EKG 12-Lead  . EKG 12-Lead      Management plans discussed with the patient, family and they are in agreement.  CODE STATUS:     Code Status Orders        Start     Ordered   02/24/16 1703  Full code   Continuous     02/24/16 1712    Code Status History    Date Active Date Inactive Code Status Order ID Comments  User Context   This patient has a current code status but no historical code status.      TOTAL TIME TAKING CARE OF THIS PATIENT: 35 minutes.    Altamese DillingVACHHANI, Latifah Padin M.D on 02/25/2016 at 12:45 PM  Between 7am to 6pm - Pager - [phone]  After 6pm go to [email or web address] - Social research officer, governmentpassword [email or web address]  [facility]  Office  [phone]  CC: Primary care physician; No primary care provider on file.   Note: This dictation was prepared with Dragon dictation along with smaller phrase technology. Any transcriptional errors that result from this process are unintentional.

## 2016-02-25 NOTE — Progress Notes (Signed)
Patient states he is going to have his wife come and pick him up so he can leave today. Informed patient that if he leaves against medical advice his hospital admission will not be covered. Patient then stated he would wait for the doctor. Dr. Elisabeth PigeonVachhani paged and MD stated he would be here to see the patient within the hour. Patient updated and stated he would wait.

## 2016-02-25 NOTE — Consult Note (Signed)
CARDIOLOGY CONSULT NOTE  Patient ID: Dominic Garrett, MRN: [id number], DOB/AGE: April 18, 1976 40 y.o. Admit date: 02/24/2016 Date of Consult: 02/25/2016  Primary Physician: No primary care provider on file. Primary Cardiologist:  Consulting Physician hospitalists  Chief Complaint: + TRoponon   HPI Dominic Garrett is a 40 y.o. male  admitted yesterday with positive troponin in the context of cocaine use. Laboratories were also notable for hyper transaminasemia  He notes that he is use drugs for a long time; his last cocaine use was intentional 20-25 days ago. He said that yesterday's event was associated with a laced drink.  It is his desire to stop using alcohol and drugs.  He denies chest pain or shortness of breath.     No past medical history on file.    Surgical History: History reviewed. No pertinent past surgical history.   Home Meds: Prior to Admission medications   Medication Sig Start Date End Date Taking? Authorizing Provider  HYDROcodone-acetaminophen (NORCO) 5-325 MG per tablet Take 1 tablet by mouth every 6 (six) hours as needed for moderate pain. 04/25/15   III Kristine Garbe Ruffian, PA-C  methocarbamol (ROBAXIN) 500 MG tablet Take 1 tablet (500 mg total) by mouth 4 (four) times daily. 04/25/15   III William C Ruffian, PA-C  predniSONE (STERAPRED UNI-PAK 21 TAB) 10 MG (21) TBPK tablet Take 6 tablets on day 1 Take 5 tablets on day 2 Take 4 tablets on day 3 Take 3 tablets on day 4 Take 2 tablets on day 5 Take 1 tablet on day 6 04/25/15   III Rosalyn Gess, PA-C    Inpatient Medications:  . aspirin  324 mg Oral Once  . aspirin EC  81 mg Oral Daily  . docusate sodium  100 mg Oral BID    Allergies: No Known Allergies  Social History   Social History  . Marital Status: Single    Spouse Name: N/A  . Number of Children: N/A  . Years of Education: N/A   Occupational History  . Not on file.   Social History Main Topics  . Smoking status: Current  Every Day Smoker  . Smokeless tobacco: Not on file  . Alcohol Use: Yes  . Drug Use: Yes    Special: Marijuana, Cocaine, Methamphetamines  . Sexual Activity: Not on file   Other Topics Concern  . Not on file   Social History Narrative    No family history on file.   ROS:  Please see the history of present illness.     All other systems reviewed and negative.    Physical Exam:   Blood pressure 128/81, pulse 82, temperature 98.2 F (36.8 C), temperature source Oral, resp. rate 16, height  (1.676 m), weight 154 lb 9.6 oz (70.126 kg), SpO2 94 %. General: Well developed, well nourished male in no acute distress. Head: Normocephalic, atraumatic, sclera non-icteric, no xanthomas, nares are without discharge. EENT: normal  Lymph Nodes:  none Neck: Negative for carotid bruits. JVD not elevated. Back:without scoliosis kyphosis Lungs: Clear bilaterally to auscultation without wheezes, rales, or rhonchi. Breathing is unlabored. Heart: RRR with S1 S2. No  murmur . No rubs, or gallops appreciated. Abdomen: Soft, non-tender, non-distended with normoactive bowel sounds. No hepatomegaly. No rebound/guarding. No obvious abdominal masses. Msk:  Strength and tone appear normal for age. Extremities: No clubbing or cyanosis. No*  edema.  Distal pedal pulses are 2+ and equal bilaterally. Skin: Warm and Dry Neuro: Alert and oriented  X 3. CN III-XII intact Grossly normal sensory and motor function . Psych:  Responds to questions appropriately with a normal affect.      Labs: Cardiac Enzymes  Recent Labs  02/24/16 1507 02/24/16 1803 02/25/16 0005 02/25/16 0555  TROPONINI 0.25* 0.32* 0.24* 0.18*   CBC Lab Results  Component Value Date   WBC 11.2* 02/25/2016   HGB 14.2 02/25/2016   HCT 41.5 02/25/2016   MCV 96.7 02/25/2016   PLT 124* 02/25/2016   PROTIME:  Recent Labs  02/24/16 1507  LABPROT 14.2  INR 1.08   Chemistry  Recent Labs Lab 02/24/16 1507 02/25/16 0555  NA 138  137  K 5.5* 3.9  CL 102 102  CO2 23 29  BUN 20 14  CREATININE 1.53* 1.07  CALCIUM 8.7* 8.2*  PROT 8.6*  --   BILITOT 0.7  --   ALKPHOS 55  --   ALT 165*  --   AST 273*  --   GLUCOSE 106* 105*   Lipids No results found for: CHOL, HDL, LDLCALC, TRIG BNP No results found for: PROBNP Thyroid Function Tests: No results for input(s): TSH, T4TOTAL, T3FREE, THYROIDAB in the last 72 hours.  Invalid input(s): FREET3 Miscellaneous No results found for: DDIMER  Radiology/Studies:  Ct Head Wo Contrast  02/24/2016  CLINICAL DATA:  Altered mental status, cocaine use yesterday EXAM: CT HEAD WITHOUT CONTRAST TECHNIQUE: Contiguous axial images were obtained from the base of the skull through the vertex without intravenous contrast. COMPARISON:  04/14/2011 FINDINGS: No evidence of hemorrhage or extra-axial fluid. No evidence of mass or vascular territory infarct. Left temporal horn is diminutive and asymmetric compared to the contralateral side but the appearance is identical to the prior study. No hydrocephalus. Calvarium intact. Visualized portions of paranasal sinuses clear. IMPRESSION: No acute intracranial abnormality. Electronically Signed   By: Esperanza Heiraymond  Rubner M.D.   On: 02/24/2016 16:42   Dg Chest Portable 1 View  02/24/2016  CLINICAL DATA:  Drug use last night, diaphoretic and confused. EXAM: PORTABLE CHEST 1 VIEW COMPARISON:  None. FINDINGS: Heart size is upper normal. Perhaps mild central pulmonary vascular congestion but not convincing. Lungs otherwise clear. No pleural effusion or pneumothorax seen. Osseous structures about the chest are unremarkable. IMPRESSION: Perhaps mild central pulmonary vascular congestion. Otherwise unremarkable chest x-ray. Electronically Signed   By: Bary RichardStan  Maynard M.D.   On: 02/24/2016 16:42    EKG:  Sinus without ST changes   Assessment and Plan:  Elevated troponin  Cocaine use  We discussed the mechanisms of cocaine-induced heart injury. He declares is  warned that he would like to become drug and alcohol free. We have discussed the importance of community in accomplishing this and wishes of trying to do it by himself.  We will stop his aspirin and his heparin. I will let the office noted try to arrange an outpatient Myoview scan as one of the mechanisms of cocaine-induced cardiac injury is accelerated atherosclerosis       Sherryl MangesSteven Klein

## 2016-02-25 NOTE — Progress Notes (Signed)
ANTICOAGULATION CONSULT NOTE - Initial Consult  Pharmacy Consult for Heparin Indication: EKG changes not consistent with STEMI   No Known Allergies  Patient Measurements: Height: 5\' 6"  (167.6 cm) Weight: 154 lb 9.6 oz (70.126 kg) IBW/kg (Calculated) : 63.8 Heparin Dosing Weight: 72.6 kg  Vital Signs: Temp: 98.5 F (36.9 C) (04/30 0518) Temp Source: Oral (04/30 0518) BP: 127/87 mmHg (04/30 0518) Pulse Rate: 82 (04/30 0518)  Labs:  Recent Labs  02/24/16 1507 02/24/16 1803 02/25/16 0005 02/25/16 0555  HGB 16.0  --   --  14.2  HCT 47.3  --   --  41.5  PLT 161  --   --  124*  APTT 28  --   --   --   LABPROT 14.2  --   --   --   INR 1.08  --   --   --   HEPARINUNFRC  --   --  0.70 0.57  CREATININE 1.53*  --   --  1.07  TROPONINI 0.25* 0.32* 0.24* 0.18*    Estimated Creatinine Clearance: 84.5 mL/min (by C-G formula based on Cr of 1.07).   Medical History: No past medical history on file.  Medications:  Infusions:  . heparin 900 Units/hr (02/24/16 1731)    Assessment: Patient presents diaphoretc , altered mental status and hypotension with reported drug abuse last night.   Elevated troponin (likely related to drug abuse) and EKG not consistent with STEMI however Cardiology recommends heparin and aspirin therapy  Goal of Therapy:  Heparin level 0.3-0.7 units/ml Monitor platelets by anticoagulation protocol: Yes   Plan:  Heparin level subtherapeutic. 2200 unit x 1 bolus and increase rate to 1150 units/hr. Will recheck level in 6 hours.  Carola FrostNathan A Wladyslawa Disbro, Pharm.D., BCPS Clinical Pharmacist  02/25/2016,7:29 AM

## 2016-02-26 ENCOUNTER — Other Ambulatory Visit: Payer: Self-pay

## 2016-02-26 ENCOUNTER — Telehealth: Payer: Self-pay | Admitting: Internal Medicine

## 2016-02-26 DIAGNOSIS — R7989 Other specified abnormal findings of blood chemistry: Principal | ICD-10-CM

## 2016-02-26 DIAGNOSIS — R778 Other specified abnormalities of plasma proteins: Secondary | ICD-10-CM

## 2016-02-26 NOTE — Telephone Encounter (Signed)
Duke SalviaSteven C Klein, MD  Oneida ArenasNancy E Everett; Shon BatonSharon H Yow, RN           Can u Ezzie Dural[plz arrange outpt myoview for this man>> + Tn and cocaine     Attempted to call pt twice. Phone rings once then silence. No answer, no VM. Will call back.

## 2016-02-26 NOTE — Telephone Encounter (Signed)
Called pt to schedule myoview.  Rings once then silence.  No answer, no VM Will call again

## 2016-02-26 NOTE — Telephone Encounter (Signed)
No answer, no VM   Letter sent

## 2017-01-09 IMAGING — CT CT HEAD W/O CM
3 series · 16 of 30 positions shown, 17 images · non-contrast
Comparison: 04/14/2011

CLINICAL DATA: Altered mental status, cocaine use yesterday

EXAM:
CT HEAD WITHOUT CONTRAST
TECHNIQUE: Contiguous axial images were obtained from the base of the skull
through the vertex without intravenous contrast.

[Series 2: head bone · axial · 0.44mm/px · z∈[-128,+12]mm · 8 of 85 slices shown]
[im 10/85  bone]
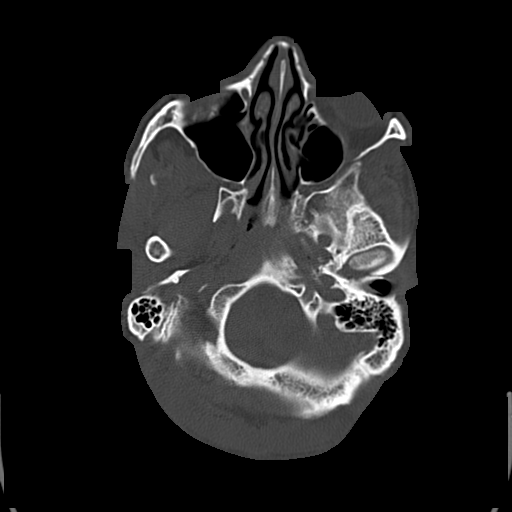
[im 20/85  bone]
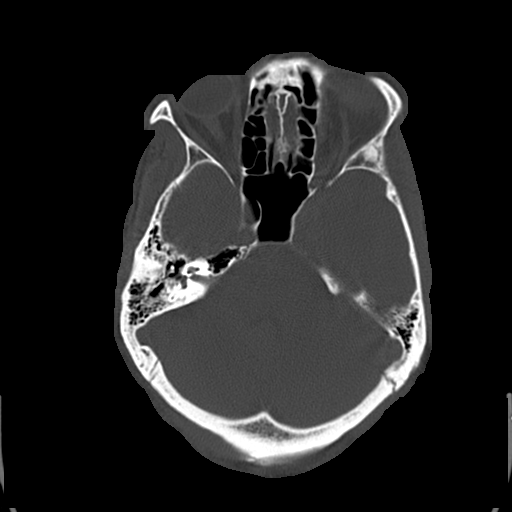
[im 30/85  bone]
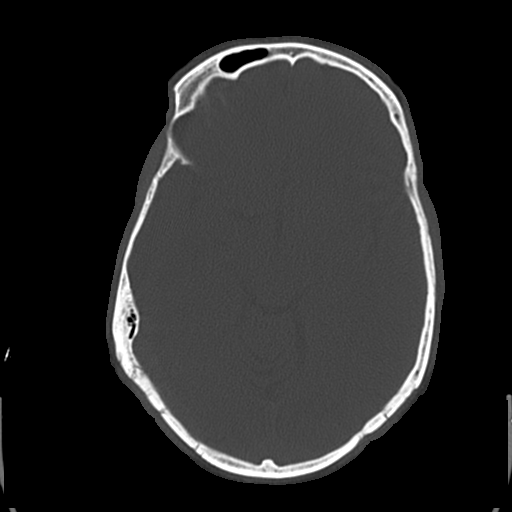
[im 40/85  bone]
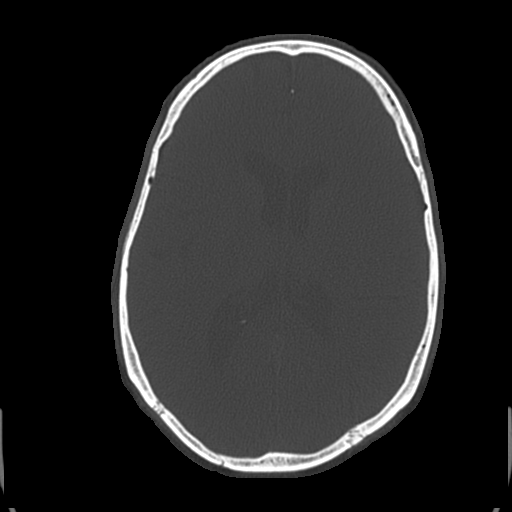
[im 50/85  bone]
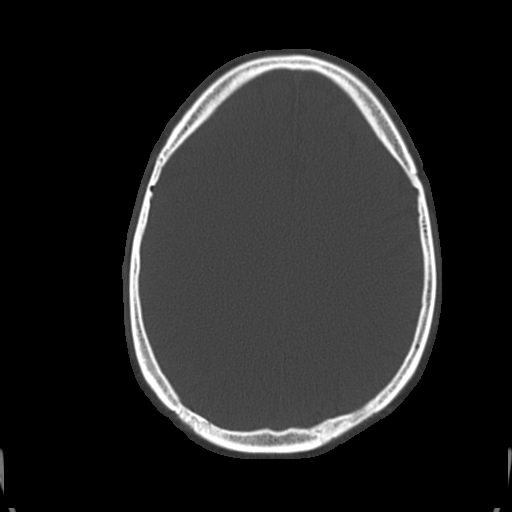
[im 60/85  bone]
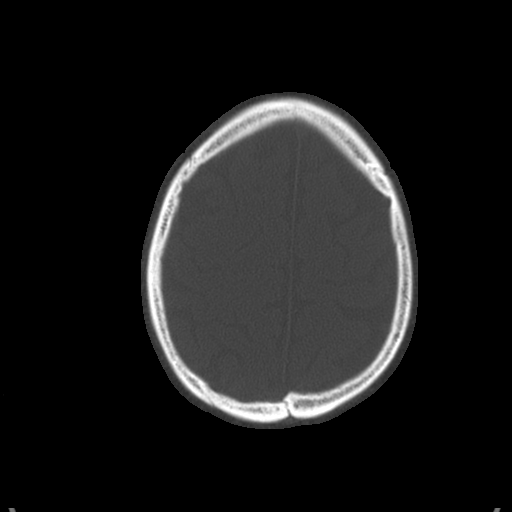
[im 70/85  bone]
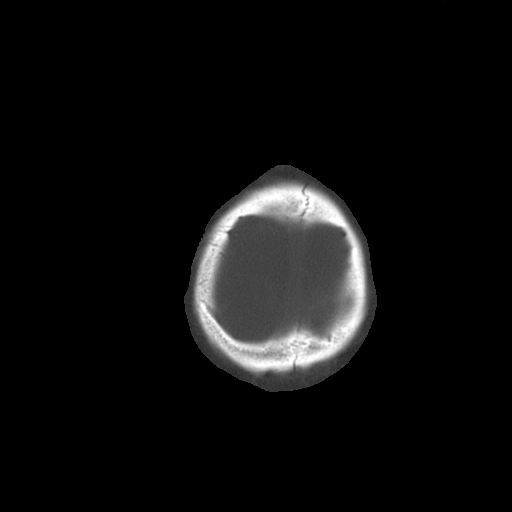
[im 80/85  bone]
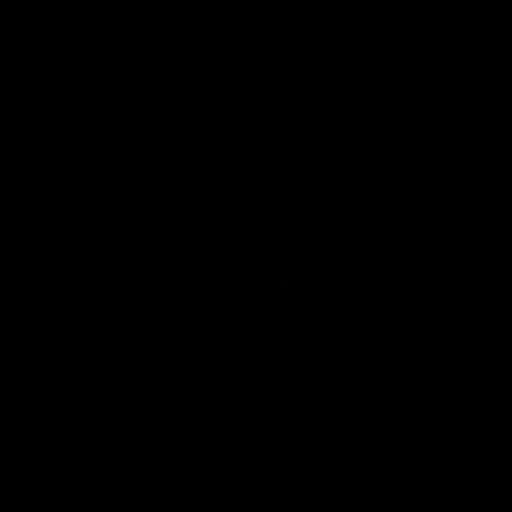

[Series 3: head wo · axial · 0.44mm/px · z∈[-107,-17]mm · 4 of 31 slices shown]
[im 7/31  brain]
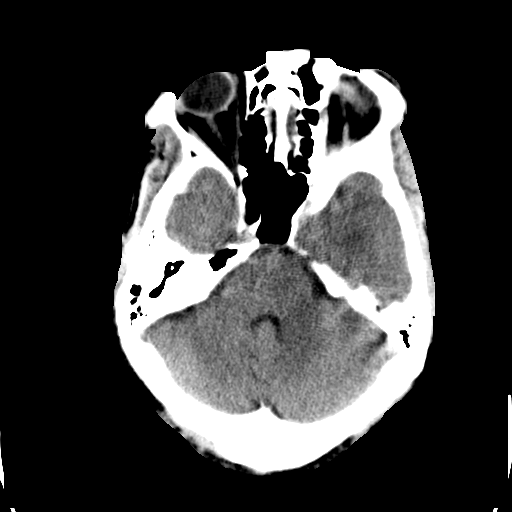
[im 13/31  brain]
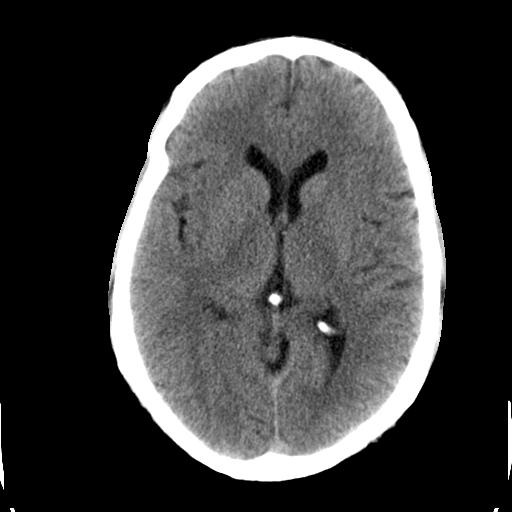
[im 19/31  brain]
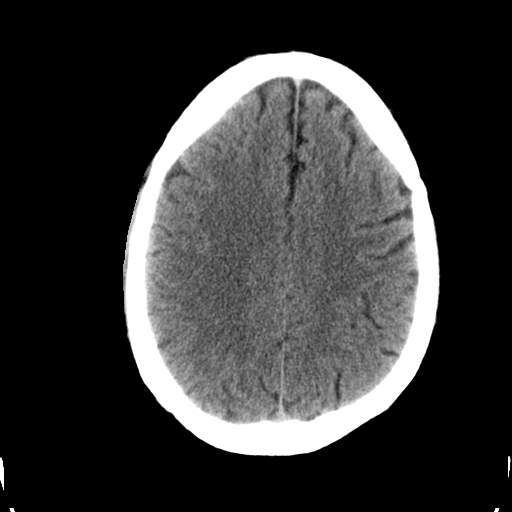
[im 25/31  brain]
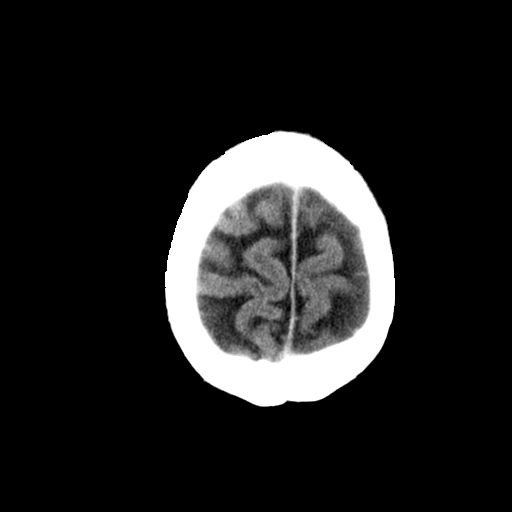

[Series 4: head wo recon · axial · 0.44mm/px · z∈[-85,+4]mm · 4 of 32 slices shown, 5 images]
[im 7/32  brain]
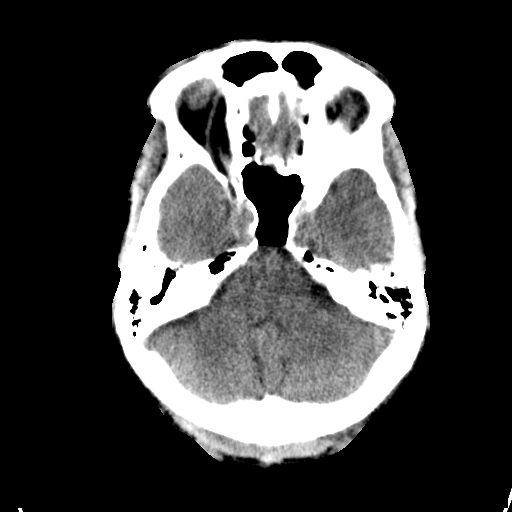
[im 7/32  bone]
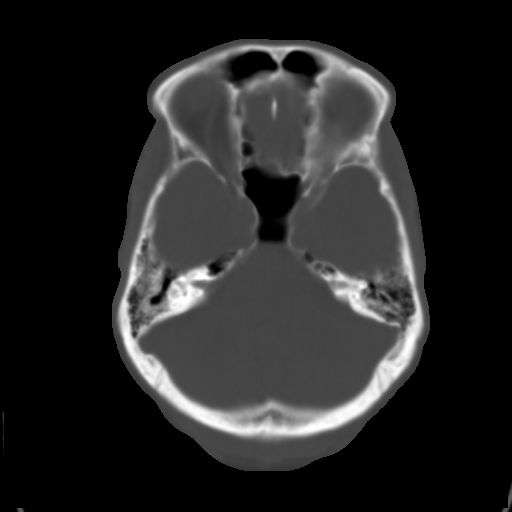
[im 13/32  brain]
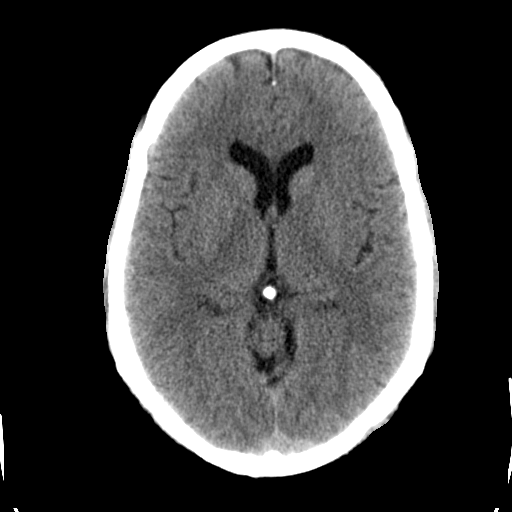
[im 19/32  brain]
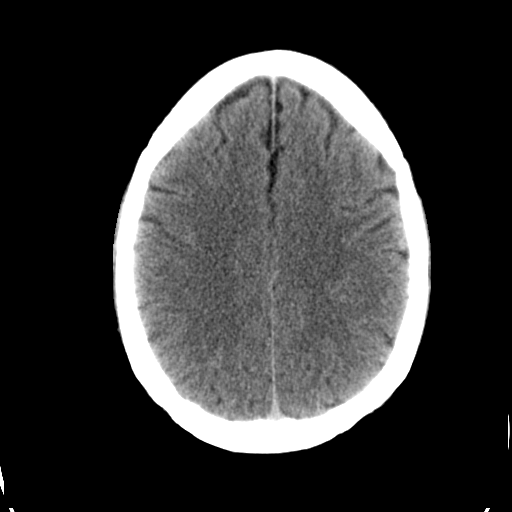
[im 25/32  brain]
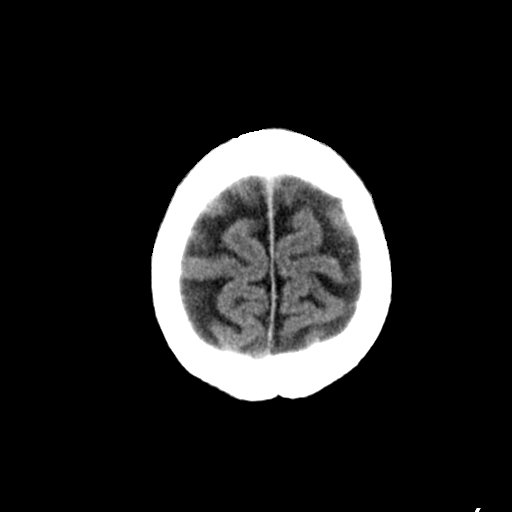

[16 of 30 positions shown; findings below may reference images not displayed]

FINDINGS: No evidence of hemorrhage or extra-axial fluid. No evidence of mass
or vascular territory infarct. Left temporal horn is diminutive and
asymmetric compared to the contralateral side but the appearance is
identical to the prior study. No hydrocephalus. Calvarium intact.
Visualized portions of paranasal sinuses clear.
IMPRESSION: No acute intracranial abnormality.

## 2017-04-22 ENCOUNTER — Encounter: Payer: Self-pay | Admitting: Emergency Medicine

## 2017-04-22 ENCOUNTER — Emergency Department
Admission: EM | Admit: 2017-04-22 | Discharge: 2017-04-22 | Disposition: A | Discharge status: Home / Self Care | Payer: Self-pay | Attending: Emergency Medicine | Admitting: Emergency Medicine

## 2017-04-22 DIAGNOSIS — F17200 Nicotine dependence, unspecified, uncomplicated: Secondary | ICD-10-CM | POA: Insufficient documentation

## 2017-04-22 DIAGNOSIS — Z79899 Other long term (current) drug therapy: Secondary | ICD-10-CM | POA: Insufficient documentation

## 2017-04-22 DIAGNOSIS — K047 Periapical abscess without sinus: Secondary | ICD-10-CM | POA: Insufficient documentation

## 2017-04-22 MED ORDER — LIDOCAINE-EPINEPHRINE 2 %-1:100000 IJ SOLN
1.7000 mL | Freq: Once | INTRAMUSCULAR | Status: DC
  Filled 2017-04-22: qty 1.7

## 2017-04-22 MED ORDER — CLINDAMYCIN HCL 300 MG PO CAPS: 300.0000 mg | ORAL_CAPSULE | Freq: Four times a day (QID) | ORAL | 0 refills | Status: DC

## 2017-04-22 MED ORDER — MAGIC MOUTHWASH W/LIDOCAINE: 5.0000 mL | Freq: Four times a day (QID) | ORAL | 0 refills | Status: DC

## 2017-04-22 MED ORDER — IBUPROFEN 800 MG PO TABS: 800.0000 mg | ORAL_TABLET | Freq: Three times a day (TID) | ORAL | 0 refills | Status: DC | PRN

## 2017-04-22 MED ORDER — CLINDAMYCIN HCL 150 MG PO CAPS
300.0000 mg | ORAL_CAPSULE | Freq: Once | ORAL | Status: AC
  Administered 2017-04-22: 300 mg via ORAL
  Filled 2017-04-22: qty 2

## 2017-04-22 NOTE — Discharge Instructions (Signed)
OPTIONS FOR DENTAL FOLLOW UP CARE ° °[place] [facility] and [facility] - Local Safety Net Dental Clinics °http://www.ncdhhs.gov/dph/oralhealth/services/safetynetclinics.htm °  °[facility] (336-562-3123) ° °[facility] [place] ([phone]) ° °[facility] ([phone] ext [phone]) ° °[facility] ([phone]) ° °[facility] ([phone]) °This clinic caters to the indigent population and is on a lottery system. °Location: °[facility], [address], [place] °Clinic Hours: °Wednesdays from 6pm - 9pm, patients seen by a lottery system. °For dates, call or go to www.med.[facility].edu/[facility]/patients/Dental-[facility] °Services: °Cleanings, fillings and simple extractions. °Payment Options: °DENTAL WORK IS FREE OF CHARGE. Bring proof of income or support. °Best way to get seen: °Arrive at 5:15 pm - this is a lottery, NOT first come/first serve, so arriving earlier will not increase your chances of being seen. °  °  °[facility] °[phone] °Select option 1 for emergencies °  °Location: °[facility], [address], [place] °Clinic Hours: °No walk-ins accepted - call the day before to schedule an appointment. °Check in times are 9:30 am and 1:30 pm. °Services: °Simple extractions, temporary fillings, pulpectomy/pulp debridement, uncomplicated abscess drainage. °Payment Options: °PAYMENT IS DUE AT THE TIME OF SERVICE.  Fee is usually $100-200, additional surgical procedures (e.g. abscess drainage) may be extra. °Cash, checks, Visa/MasterCard accepted.  Can file Medicaid if patient is covered for dental - patient should call case worker to check. °No discount for [facility] patients. °Best way to get seen: °MUST call the day before and get onto the schedule. Can usually be seen the next 1-2 days. No walk-ins accepted. °  °  °[facility] °[phone] °   °Location: °[facility], [address], [place] °Clinic Hours: °M, W, Th, F 8am or 1:30pm, Tues 9a or 1:30 - first come/first served. °Services: °Simple extractions, temporary fillings, uncomplicated abscess drainage.  You do not need to be an [place] resident. °Payment Options: °PAYMENT IS DUE AT THE TIME OF SERVICE. °Dental insurance, otherwise sliding scale - bring proof of income or support. °Depending on income and treatment needed, cost is usually $50-200. °Best way to get seen: °Arrive early as it is first come/first served. °  °  °[facility] °[phone] °  °Location: °[address] °Clinic Hours: °Mon-Thu 8a-5p °Services: °Most basic dental services including extractions and fillings. °Payment Options: °PAYMENT IS DUE AT THE TIME OF SERVICE. °Sliding scale, up to 50% off - bring proof if income or support. °Medicaid with dental option accepted. °Best way to get seen: °Call to schedule an appointment, can usually be seen within 2 weeks OR they will try to see walk-ins - show up at 8a or 2p (you may have to wait). °  °  °[facility] °[phone] °[place] RESIDENTS ONLY °  °Location: °[facility], [address], [place], [place] [address] °Clinic Hours: By appointment only. °Monday - Thursday 8am-5pm, Friday 8am-12pm °Services: Cleanings, fillings, extractions. °Payment Options: °PAYMENT IS DUE AT THE TIME OF SERVICE. °Cash, Visa or MasterCard. Sliding scale - $30 minimum per service. °Best way to get seen: °Come in to office, complete packet and make an appointment - need proof of income °or support monies for each household member and proof of [place] residence. °Usually takes about a month to get in. °  °  °[facility] °[phone] °  °Location: °[address]   [place] °Clinic Hours: Walk-in Urgent Care Dental Services are offered Monday-Friday  mornings only. °The numbers of emergencies accepted daily is limited to the number of °providers available. °Maximum 15 - Mondays, Wednesdays & Thursdays °Maximum 10 - Tuesdays & Fridays °Services: °You do not need to be a [place] resident to be seen for a dental emergency. °Emergencies are defined as pain, swelling, abnormal bleeding, or dental trauma. Walkins will receive x-rays if needed. °NOTE: Dental cleaning is not an emergency. °Payment Options: °PAYMENT IS DUE AT THE TIME OF SERVICE. °Minimum co-pay is $40.00 for uninsured patients. °Minimum co-pay is $3.00 for Medicaid with dental coverage. °Dental Insurance is accepted and must be presented at time of visit. °Medicare does not cover dental. °Forms of payment: Cash, credit card, checks. °Best way to get seen: °If not previously registered with the clinic, walk-in dental registration begins at 7:15 am and is on a first come/first serve basis. °If previously registered with the clinic, call to make an appointment. °  °  °[facility] °[phone] °[place] RESIDENTS ONLY °  °Location: °[address], [place], [place] °Clinic Hours: °Mon-Thu 10a-2p °Services: Extractions only! °Payment Options: °FREE (donations accepted) - bring proof of income or support °Best way to get seen: °Call and schedule an appointment OR come at 8am on the 1st Monday of every month (except for holidays) when it is first come/first served. °  °  °[facility] °[phone] °  °Location: °[address], [place] °Clinic Hours: °Friday mornings °Services, Payment Options, Best way to get seen: °Call for info °

## 2017-04-22 NOTE — ED Notes (Addendum)
Bottom left last molar with caries. Swollen, painful.

## 2017-04-22 NOTE — ED Triage Notes (Signed)
C/o pain to left lower tooth.  Mild swelling to mandible area.  No fevers.  Ambulatory to triage.  No breathing/swallowing difficulty.

## 2017-04-22 NOTE — ED Provider Notes (Signed)
[facility] Emergency Department Provider Note  ____________________________________________  Time seen: Approximately 3:32 PM  I have reviewed the triage vital signs and the nursing notes.   HISTORY  Chief Complaint Oral Swelling    HPI Dominic Garrett is a 41 y.o. male who presents to emergency department complaining of left lower dental pain and infection. Patient reports that he has a tooth that is eroded into his gum on the left side. He reports that he has had some swelling, erythema to this area. He denies any drainage. Patient denies any fevers or chills, difficulty breathing or swallowing. Patient does not have a dentist. No other complaints at this time.   History reviewed. No pertinent past medical history.  Patient Active Problem List   Diagnosis Date Noted  . Chest pain 02/24/2016    History reviewed. No pertinent surgical history.  Prior to Admission medications   Medication Sig Start Date End Date Taking? Authorizing Provider  clindamycin (CLEOCIN) 300 MG capsule Take 1 capsule (300 mg total) by mouth 4 (four) times daily. 04/22/17   Cuthriell, Delorise Royals, PA-C  HYDROcodone-acetaminophen (NORCO) 5-325 MG per tablet Take 1 tablet by mouth every 6 (six) hours as needed for moderate pain. 04/25/15   Ruffian, III Kristine Garbe, PA-C  ibuprofen (ADVIL,MOTRIN) 800 MG tablet Take 1 tablet (800 mg total) by mouth every 8 (eight) hours as needed. 04/22/17   Cuthriell, Delorise Royals, PA-C  magic mouthwash w/lidocaine SOLN Take 5 mLs by mouth 4 (four) times daily. 04/22/17   Cuthriell, Delorise Royals, PA-C  methocarbamol (ROBAXIN) 500 MG tablet Take 1 tablet (500 mg total) by mouth 4 (four) times daily. 04/25/15   Garrel Ridgel, PA-C    Allergies Patient has no known allergies.  History reviewed. No pertinent family history.  Social History Social History  Substance Use Topics  . Smoking status: Current Every Day Smoker  . Smokeless tobacco: Never  Used  . Alcohol use Yes     Review of Systems  Constitutional: No fever/chills Eyes: No visual changes. No discharge ENT: Positive for left lower dental pain and swelling. Cardiovascular: no chest pain. Respiratory: no cough. No SOB. Gastrointestinal: No abdominal pain.  No nausea, no vomiting.  No diarrhea.  No constipation. Musculoskeletal: Negative for musculoskeletal pain. Skin: Negative for rash, abrasions, lacerations, ecchymosis. Neurological: Negative for headaches, focal weakness or numbness. 10-point ROS otherwise negative.  ____________________________________________   PHYSICAL EXAM:  VITAL SIGNS: ED Triage Vitals [04/22/17 1410]  Enc Vitals Group     BP (!) 178/111     Pulse Rate 60     Resp 18     Temp 98.2 F (36.8 C)     Temp Source Oral     SpO2 100 %     Weight 150 lb (68 kg)     Height 5\' 5"  (1.651 m)     Head Circumference      Peak Flow      Pain Score 10     Pain Loc      Pain Edu?      Excl. in GC?      Constitutional: Alert and oriented. Well appearing and in no acute distress. Eyes: Conjunctivae are normal. PERRL. EOMI. Head: Atraumatic. ENT:      Ears:       Nose: No congestion/rhinnorhea.      Mouth/Throat: Mucous membranes are moist. Dental erosion into the gumline with surrounding erythema noted to the first molar the left lower dentition.  Area is edematous but no fluctuance with depression of tongue depressor. The patient of the external jaw reveals no induration or fluctuance. Neck: No stridor.   Hematological/Lymphatic/Immunilogical: No cervical lymphadenopathy. Cardiovascular: Normal rate, regular rhythm. Normal S1 and S2.  Good peripheral circulation. Respiratory: Normal respiratory effort without tachypnea or retractions. Lungs CTAB. Good air entry to the bases with no decreased or absent breath sounds. Musculoskeletal: Full range of motion to all extremities. No gross deformities appreciated. Neurologic:  Normal speech and  language. No gross focal neurologic deficits are appreciated.  Skin:  Skin is warm, dry and intact. No rash noted. Psychiatric: Mood and affect are normal. Speech and behavior are normal. Patient exhibits appropriate insight and judgement.   ____________________________________________   LABS (all labs ordered are listed, but only abnormal results are displayed)  Labs Reviewed - No data to display ____________________________________________  EKG   ____________________________________________  RADIOLOGY   No results found.  ____________________________________________    PROCEDURES  Procedure(s) performed:    Procedures  Dental block is performed using 1.7 mL of lidocaine with epi. This is injected into the inferior alveolar nerve. This performed using a 27-gauge needle. Patient tolerated well with no complications.  Medications  lidocaine-EPINEPHrine (XYLOCAINE W/EPI) 2 %-1:100000 (with pres) injection 1.7 mL (not administered)  clindamycin (CLEOCIN) capsule 300 mg (300 mg Oral Given 04/22/17 1540)     ____________________________________________   INITIAL IMPRESSION / ASSESSMENT AND PLAN / ED COURSE  Pertinent labs & imaging results that were available during my care of the patient were reviewed by me and considered in my medical decision making (see chart for details).  Review of the [place] CSRS was performed in accordance of the [facility] prior to dispensing any controlled drugs.     Patient's diagnosis is consistent with dental abscess. No indication at this time for labs or imaging. Dental block is performed as described above for symptom control.. Patient will be discharged home with prescriptions for clindamycin, Magic mouthwash, 800 mg ibuprofen. Patient is to follow up with dentist as needed or otherwise directed. Patient is given ED precautions to return to the ED for any worsening or new symptoms.     ____________________________________________  FINAL  CLINICAL IMPRESSION(S) / ED DIAGNOSES  Final diagnoses:  Dental abscess      NEW MEDICATIONS STARTED DURING THIS VISIT:  New Prescriptions   CLINDAMYCIN (CLEOCIN) 300 MG CAPSULE    Take 1 capsule (300 mg total) by mouth 4 (four) times daily.   IBUPROFEN (ADVIL,MOTRIN) 800 MG TABLET    Take 1 tablet (800 mg total) by mouth every 8 (eight) hours as needed.   MAGIC MOUTHWASH W/LIDOCAINE SOLN    Take 5 mLs by mouth 4 (four) times daily.        This chart was dictated using voice recognition software/Dragon. Despite best efforts to proofread, errors can occur which can change the meaning. Any change was purely unintentional.    Racheal PatchesCuthriell, Jonathan D, PA-C 04/22/17 1554    Minna AntisPaduchowski, Kevin, MD 04/22/17 2306

## 2017-11-02 ENCOUNTER — Emergency Department
Admission: EM | Admit: 2017-11-02 | Discharge: 2017-11-03 | Disposition: A | Discharge status: Home / Self Care | Payer: Self-pay | Attending: Student in an Organized Health Care Education/Training Program | Admitting: Student in an Organized Health Care Education/Training Program

## 2017-11-02 DIAGNOSIS — Z79899 Other long term (current) drug therapy: Secondary | ICD-10-CM | POA: Insufficient documentation

## 2017-11-02 DIAGNOSIS — F1910 Other psychoactive substance abuse, uncomplicated: Secondary | ICD-10-CM | POA: Insufficient documentation

## 2017-11-02 DIAGNOSIS — Z9114 Patient's other noncompliance with medication regimen: Secondary | ICD-10-CM | POA: Insufficient documentation

## 2017-11-02 DIAGNOSIS — F17210 Nicotine dependence, cigarettes, uncomplicated: Secondary | ICD-10-CM | POA: Insufficient documentation

## 2017-11-02 DIAGNOSIS — F259 Schizoaffective disorder, unspecified: Secondary | ICD-10-CM | POA: Insufficient documentation

## 2017-11-02 LAB — CBC
HEMATOCRIT: 45.2 % (ref 40.0–52.0)
HEMOGLOBIN: 15.4 g/dL (ref 13.0–18.0)
MCH: 31.7 pg (ref 26.0–34.0)
MCHC: 34 g/dL (ref 32.0–36.0)
MCV: 93.3 fL (ref 80.0–100.0)
Platelets: 159 10*3/uL (ref 150–440)
RBC: 4.85 MIL/uL (ref 4.40–5.90)
RDW: 14.6 % — ABNORMAL HIGH (ref 11.5–14.5)
WBC: 6.7 10*3/uL (ref 3.8–10.6)

## 2017-11-02 LAB — COMPREHENSIVE METABOLIC PANEL WITH GFR
ALT: 148 U/L — ABNORMAL HIGH (ref 17–63)
AST: 131 U/L — ABNORMAL HIGH (ref 15–41)
Albumin: 4.2 g/dL (ref 3.5–5.0)
Alkaline Phosphatase: 55 U/L (ref 38–126)
Anion gap: 10 (ref 5–15)
BUN: 13 mg/dL (ref 6–20)
CO2: 24 mmol/L (ref 22–32)
Calcium: 9 mg/dL (ref 8.9–10.3)
Chloride: 104 mmol/L (ref 101–111)
Creatinine, Ser: 1.03 mg/dL (ref 0.61–1.24)
GFR calc Af Amer: >60 mL/min
GFR calc non Af Amer: >60 mL/min
Glucose, Bld: 93 mg/dL (ref 65–99)
Potassium: 3.9 mmol/L (ref 3.5–5.1)
Sodium: 138 mmol/L (ref 135–145)
Total Bilirubin: 0.8 mg/dL (ref 0.3–1.2)
Total Protein: 8.6 g/dL — ABNORMAL HIGH (ref 6.5–8.1)

## 2017-11-02 LAB — ETHANOL: Alcohol, Ethyl (B): 68 mg/dL — ABNORMAL HIGH (ref <10)

## 2017-11-02 MED ORDER — RISPERIDONE 1 MG PO TABS: 1.0000 mg | ORAL_TABLET | Freq: Two times a day (BID) | ORAL | Status: DC

## 2017-11-02 MED ORDER — TRAZODONE HCL 50 MG PO TABS: 50.0000 mg | ORAL_TABLET | Freq: Every day | ORAL | 0 refills | Status: AC

## 2017-11-02 MED ORDER — DIVALPROEX SODIUM ER 500 MG PO TB24: 500.0000 mg | ORAL_TABLET | Freq: Two times a day (BID) | ORAL | 0 refills | Status: AC

## 2017-11-02 MED ORDER — ILOPERIDONE 4 MG PO TABS
12.0000 mg | ORAL_TABLET | Freq: Every day | ORAL | Status: DC
  Filled 2017-11-02 (×2): qty 3

## 2017-11-02 MED ORDER — DIVALPROEX SODIUM ER 250 MG PO TB24: 500.0000 mg | ORAL_TABLET | Freq: Two times a day (BID) | ORAL | Status: DC

## 2017-11-02 MED ORDER — RISPERIDONE 1 MG PO TABS: 1.0000 mg | ORAL_TABLET | Freq: Two times a day (BID) | ORAL | 0 refills | Status: AC

## 2017-11-02 NOTE — ED Notes (Signed)
Gave patient sandwich tray and drink.AS

## 2017-11-02 NOTE — BH Assessment (Signed)
Assessment Note  Dominic Garrett is an 42 y.o. male. Dominic Garrett arrived to the ED by way of personal transportation.  He reports that he has been off his medications for about 3-4 months.  He states that he was on Depakote, Fanapt, Rispredal, and Trazedone. He states that since he has been off his medications he has had a lot of anxiety, self-medicating with drugs, he states at times he hears voices, and paranoid ideation. He reports that he is currently depressed.  He states that he is feeling down, he is eating less, poor sleeping habits (difficulty staying asleep), He states that his depression keeps him from working, and that people are against him and being irritable and snappy.  Isolation from others is occurring.  He states that his anxiety is more intense, and he cannot identify triggers.  He denied current auditory or visual hallucinations.  He denied suicidal or homicidal ideation or intent.  He reports that he has been using cocaine, alcohol, and marijuana. He reports that he has been facing stress from the death of his mother 2 months ago.     Diagnosis: Depression  Past Medical History: History reviewed. No pertinent past medical history.  History reviewed. No pertinent surgical history.  Family History: No family history on file.  Social History:  reports that he has been smoking.  he has never used smokeless tobacco. He reports that he drinks alcohol. He reports that he uses drugs. Drugs: Marijuana, Cocaine, and Methamphetamines.  Additional Social History:  Alcohol / Drug Use History of alcohol / drug use?: Yes Substance #1 Name of Substance 1: Marijuana 1 - Age of First Use: 13 1 - Amount (size/oz): 1 blunt 1 - Frequency: daily 1 - Last Use / Amount: 11/02/2017 Substance #2 Name of Substance 2: Cocaine 2 - Age of First Use: 17 2 - Amount (size/oz): 1 gram 2 - Frequency: daily 2 - Last Use / Amount: 11/02/2017 Substance #3 Name of Substance 3: Alcohol 3 - Age of First Use:  8 3 - Amount (size/oz): 12 pk 3 - Frequency: daily 3 - Last Use / Amount: 11/02/2017  CIWA: CIWA-Ar BP: 131/85 Pulse Rate: 72 COWS:    Allergies: No Known Allergies  Home Medications:  (Not in a hospital admission)  OB/GYN Status:  No LMP for male patient.  General Assessment Data Location of Assessment: [facility] ED TTS Assessment: In system Is this a Tele or Face-to-Face Assessment?: Face-to-Face Is this an Initial Assessment or a Re-assessment for this encounter?: Initial Assessment Marital status: Single Maiden name: n/a Is patient pregnant?: No Pregnancy Status: No Living Arrangements: Alone Can pt return to current living arrangement?: Yes Admission Status: Voluntary Is patient capable of signing voluntary admission?: Yes Referral Source: Self/Family/Friend Insurance type: None  Medical Screening Exam [facility] Walk-in ONLY) Medical Exam completed: Yes  Crisis Care Plan Living Arrangements: Alone Legal Guardian: Other:(Self) Name of Psychiatrist: None at this time Name of Therapist: None at this time  Education Status Is patient currently in school?: No Current Grade: n/a Highest grade of school patient has completed: 10th Name of school: [employer] Contact person: n/a  Risk to self with the past 6 months Suicidal Ideation: No Has patient been a risk to self within the past 6 months prior to admission? : No Suicidal Intent: No Has patient had any suicidal intent within the past 6 months prior to admission? : No Is patient at risk for suicide?: No Suicidal Plan?: No Has patient had any suicidal plan  within the past 6 months prior to admission? : No Access to Means: No What has been your use of drugs/alcohol within the last 12 months?: daily use of marijuana, cocaine, and alcohol Previous Attempts/Gestures: No How many times?: 0 Other Self Harm Risks: denied Triggers for Past Attempts: None known Intentional Self Injurious Behavior: None Family Suicide  History: No Recent stressful life event(s): Other (Comment)(Death of mother) Persecutory voices/beliefs?: No Depression: Yes Depression Symptoms: Despondent, Loss of interest in usual pleasures, Feeling angry/irritable Substance abuse history and/or treatment for substance abuse?: Yes Suicide prevention information given to non-admitted patients: Not applicable  Risk to Others within the past 6 months Homicidal Ideation: No Does patient have any lifetime risk of violence toward others beyond the six months prior to admission? : No Thoughts of Harm to Others: No Current Homicidal Intent: No Current Homicidal Plan: No Access to Homicidal Means: No Identified Victim: None identified History of harm to others?: No Assessment of Violence: None Noted Violent Behavior Description: denied Does patient have access to weapons?: No Criminal Charges Pending?: Yes Describe Pending Criminal Charges: Crecencio McFelony Larceny, Obtaining property under false pretense, felony larceny of a motor vehicle Does patient have a court date: Yes Court Date: 11/19/17(12/06/2017) Is patient on probation?: No  Psychosis Hallucinations: None noted Delusions: (paranoid)  Mental Status Report Appearance/Hygiene: In scrubs Eye Contact: Fair Motor Activity: Unremarkable Speech: Logical/coherent Level of Consciousness: Quiet/awake Mood: Depressed Affect: Flat Anxiety Level: Minimal Thought Processes: Coherent Judgement: Unimpaired Orientation: Person, Place, Time, Situation, Appropriate for developmental age Obsessive Compulsive Thoughts/Behaviors: None  Cognitive Functioning Concentration: Fair Memory: Recent Intact IQ: Average Insight: Fair Impulse Control: Fair Appetite: Poor Sleep: Decreased Vegetative Symptoms: None  ADLScreening [facility]) Patient's cognitive ability adequate to safely complete daily activities?: Yes Patient able to express need for assistance with ADLs?:  Yes Independently performs ADLs?: Yes (appropriate for developmental age)  Prior Inpatient Therapy Prior Inpatient Therapy: Yes Prior Therapy Dates: 2008 [facility] Reason for Treatment: Bipolar Disorder, Schizophrenia  Prior Outpatient Therapy Prior Outpatient Therapy: Yes Prior Therapy Dates: 2009 Prior Therapy Facilty/Provider(s): Unsure of the facility, in [place] Reason for Treatment: Bipolar Disorder, Schizophrenia Does patient have an ACCT team?: No Does patient have Intensive In-House Services?  : No Does patient have [facility] services? : No Does patient have P4CC services?: No  ADL Screening (condition at time of admission) Patient's cognitive ability adequate to safely complete daily activities?: Yes Is the patient deaf or have difficulty hearing?: No Does the patient have difficulty seeing, even when wearing glasses/contacts?: No Does the patient have difficulty concentrating, remembering, or making decisions?: No Patient able to express need for assistance with ADLs?: Yes Does the patient have difficulty dressing or bathing?: No Independently performs ADLs?: Yes (appropriate for developmental age) Does the patient have difficulty walking or climbing stairs?: No Weakness of Legs: None Weakness of Arms/Hands: None  Home Assistive Devices/Equipment Home Assistive Devices/Equipment: None               Additional Information 1:1 In Past 12 Months?: No CIRT Risk: No Elopement Risk: No Does patient have medical clearance?: Yes     Disposition:  Disposition Initial Assessment Completed for this Encounter: Yes Disposition of Patient: Pending Review with psychiatrist  On Site Evaluation by:   Reviewed with Physician:    Justice DeedsKeisha Everlene Cunning 11/02/2017 8:32 PM

## 2017-11-02 NOTE — ED Triage Notes (Signed)
Pt came to ED via pov for psychiatric evaluation. Pt calm and cooperative in triage. Denies hi/si. Reports has been off his meds and would like to get back on. (risperdal and depakote)

## 2017-11-02 NOTE — ED Provider Notes (Signed)
[facility] Emergency Department Provider Note    First MD Initiated Contact with Patient 11/02/17 1724     (approximate)  I have reviewed the triage vital signs and the nursing notes.   HISTORY  Chief Complaint Psychiatric Evaluation    HPI Dominic Garrett is a 42 y.o. male with a history of schizoaffective disorder as well as polysubstance abuse currently off his medications presents for psychiatric evaluation.  Denies any SI or HI.  States that he has been trying to supplement his previous medications by using powder cocaine and drinking.  States that he is recently lost his job and is losing family members and realizes that his behaviors are abnormal wants to get back on his medications.  Patient is slightly odd.  Again no SI or HI and denies any hallucinations.  Would like to be evaluated to see if he would benefit from hospitalization.  History reviewed. No pertinent past medical history. No family history on file. History reviewed. No pertinent surgical history. Patient Active Problem List   Diagnosis Date Noted  . Chest pain 02/24/2016      Prior to Admission medications   Medication Sig Start Date End Date Taking? Authorizing Provider  Iloperidone 6 MG TABS Take 12 mg by mouth daily.   Yes [provider]  divalproex (DEPAKOTE ER) 500 MG 24 hr tablet Take 1 tablet (500 mg total) by mouth 2 (two) times daily for 14 days. 11/02/17 11/16/17  Willy Eddyobinson, Maritza Hosterman, MD  risperiDONE (RISPERDAL) 1 MG tablet Take 1 tablet (1 mg total) by mouth 2 (two) times daily for 14 days. 11/02/17 11/16/17  Willy Eddyobinson, Niyla Marone, MD  traZODone (DESYREL) 50 MG tablet Take 1 tablet (50 mg total) by mouth at bedtime for 14 days. 11/02/17 11/16/17  Willy Eddyobinson, Ruberta Holck, MD    Allergies Patient has no known allergies.    Social History Social History   Tobacco Use  . Smoking status: Current Every Day Smoker  . Smokeless tobacco: Never Used  Substance Use Topics  .  Alcohol use: Yes  . Drug use: Yes    Types: Marijuana, Cocaine, Methamphetamines    Review of Systems Patient denies headaches, rhinorrhea, blurry vision, numbness, shortness of breath, chest pain, edema, cough, abdominal pain, nausea, vomiting, diarrhea, dysuria, fevers, rashes or hallucinations unless otherwise stated above in HPI. ____________________________________________   PHYSICAL EXAM:  VITAL SIGNS: Vitals:   11/02/17 1655 11/02/17 1803  BP: (!) 147/93 131/85  Pulse: (!) 101 72  Resp: 20 18  Temp: 98.2 F (36.8 C) 98 F (36.7 C)  SpO2: 100% 99%    Constitutional: Alert and oriented. Well appearing and in no acute distress. Eyes: Conjunctivae are normal.  Head: Atraumatic. Nose: No congestion/rhinnorhea. Mouth/Throat: Mucous membranes are moist.   Neck: No stridor. Painless ROM.  Cardiovascular: Normal rate, regular rhythm. Grossly normal heart sounds.  Good peripheral circulation. Respiratory: Normal respiratory effort.  No retractions. Lungs CTAB. Gastrointestinal: Soft and nontender. No distention. No abdominal bruits. No CVA tenderness. Genitourinary:  Musculoskeletal: No lower extremity tenderness nor edema.  No joint effusions. Neurologic:  Normal speech and language. No gross focal neurologic deficits are appreciated. No facial droop Skin:  Skin is warm, dry and intact. No rash noted. Psychiatric: Mood is normal but patient is a bit odd. Speech and behavior are normal.  ____________________________________________   LABS (all labs ordered are listed, but only abnormal results are displayed)  Results for orders placed or performed during the hospital encounter of 11/02/17 (from  the past 24 hour(s))  Comprehensive metabolic panel     Status: Abnormal   Collection Time: 11/02/17  4:55 PM  Result Value Ref Range   Sodium 138 135 - 145 mmol/L   Potassium 3.9 3.5 - 5.1 mmol/L   Chloride 104 101 - 111 mmol/L   CO2 24 22 - 32 mmol/L   Glucose, Bld 93 65 - 99  mg/dL   BUN 13 6 - 20 mg/dL   Creatinine, Ser 0.98 0.61 - 1.24 mg/dL   Calcium 9.0 8.9 - 11.9 mg/dL   Total Protein 8.6 (H) 6.5 - 8.1 g/dL   Albumin 4.2 3.5 - 5.0 g/dL   AST 147 (H) 15 - 41 U/L   ALT 148 (H) 17 - 63 U/L   Alkaline Phosphatase 55 38 - 126 U/L   Total Bilirubin 0.8 0.3 - 1.2 mg/dL   GFR calc non Af Amer >60 >60 mL/min   GFR calc Af Amer >60 >60 mL/min   Anion gap 10 5 - 15  Ethanol     Status: Abnormal   Collection Time: 11/02/17  4:55 PM  Result Value Ref Range   Alcohol, Ethyl (B) 68 (H) <10 mg/dL  cbc     Status: Abnormal   Collection Time: 11/02/17  4:55 PM  Result Value Ref Range   WBC 6.7 3.8 - 10.6 K/uL   RBC 4.85 4.40 - 5.90 MIL/uL   Hemoglobin 15.4 13.0 - 18.0 g/dL   HCT 82.9 56.2 - 13.0 %   MCV 93.3 80.0 - 100.0 fL   MCH 31.7 26.0 - 34.0 pg   MCHC 34.0 32.0 - 36.0 g/dL   RDW 86.5 (H) 78.4 - 69.6 %   Platelets 159 150 - 440 K/uL   ____________________________________________ ____________________________________________   PROCEDURES  Procedure(s) performed:  Procedures    Critical Care performed: no ____________________________________________   INITIAL IMPRESSION / ASSESSMENT AND PLAN / ED COURSE  Pertinent labs & imaging results that were available during my care of the patient were reviewed by me and considered in my medical decision making (see chart for details).  DDX: Psychosis, delirium, medication effect, noncompliance, polysubstance abuse, Si, Hi, depression   Dominic Garrett is a 42 y.o. who presents to the ED with symptoms as described above.  He is a bit odd but do not believe the patient meets any criteria for IVC at this time.  Symptoms likely secondary to noncompliance polysubstance abuse.  I will have psychiatry evaluate the patient as he may benefit from inpatient hospitalization.  Have discussed with the patient and available family all diagnostics and treatments performed thus far and all questions were answered to the  best of my ability. The patient demonstrates understanding and agreement with plan.   Clinical Course as of Nov 02 2240  Dominic Garrett Nov 02, 2017  2239 The patient has been evaluated at bedside by [facility] psychiatry.  Patient is clinically stable.  Not felt to be a danger to self or others.  No SI or Hi.  No indication for inpatient psychiatric admission at this time.  Appropriate for continued outpatient therapy.   [PR]    Clinical Course User Index [PR] Willy Eddy, MD     ____________________________________________   FINAL CLINICAL IMPRESSION(S) / ED DIAGNOSES  Final diagnoses:  Polysubstance abuse (HCC)  Schizoaffective disorder, unspecified type (HCC)      NEW MEDICATIONS STARTED DURING THIS VISIT:  This SmartLink is deprecated. Use AVSMEDLIST instead to display the medication list for a patient.  Note:  This document was prepared using Dragon voice recognition software and may include unintentional dictation errors.    Willy Eddy, MD 11/02/17 2242

## 2017-11-03 NOTE — ED Notes (Signed)
Pharmacy called for ordered meds and they are not available. Spoke with Dr Zenda AlpersWebster who gave orders pt can take his own home meds and be discharged.

## 2017-11-03 NOTE — ED Notes (Signed)
In room to speak with pt. Pt resting but easily aroused. Pt reports he does have a place to live in [place]. Pt drove himself to the ER. Pt will be discharged. Pt is understanding.

## 2017-11-03 NOTE — ED Notes (Signed)

## 2017-11-03 NOTE — ED Notes (Signed)
Danelle EarthlyNoel, RN into the room to discharge the patient and pt is not wanting to leave but did take his home medications as ordered. Dr Zenda AlpersWebster informed and stated pt was discharged. This RN back to the room and explained to the patient that he was discharged and pt understanding and asking for food. Told pt that I would give him a sandwich tray and a drink to take with him. Pt agreeable. Clothes given and pt is dressing.

## 2019-02-26 DEATH — deceased
